# Patient Record
Sex: Female | Born: 1965 | Race: White | Hispanic: No | Smoking: Former smoker
Health system: Southern US, Community
[De-identification: ages and names within clinical notes are randomized; demographics above are authoritative.]

## PROBLEM LIST (undated history)

## (undated) DIAGNOSIS — G43909 Migraine, unspecified, not intractable, without status migrainosus: Secondary | ICD-10-CM

## (undated) DIAGNOSIS — Z87820 Personal history of traumatic brain injury: Secondary | ICD-10-CM

## (undated) DIAGNOSIS — Z86010 Personal history of colonic polyps: Secondary | ICD-10-CM

## (undated) DIAGNOSIS — Z860101 Personal history of adenomatous and serrated colon polyps: Secondary | ICD-10-CM

## (undated) DIAGNOSIS — N950 Postmenopausal bleeding: Secondary | ICD-10-CM

## (undated) DIAGNOSIS — Z8679 Personal history of other diseases of the circulatory system: Secondary | ICD-10-CM

## (undated) DIAGNOSIS — K219 Gastro-esophageal reflux disease without esophagitis: Secondary | ICD-10-CM

## (undated) DIAGNOSIS — R011 Cardiac murmur, unspecified: Secondary | ICD-10-CM

## (undated) DIAGNOSIS — M1990 Unspecified osteoarthritis, unspecified site: Secondary | ICD-10-CM

## (undated) DIAGNOSIS — F419 Anxiety disorder, unspecified: Secondary | ICD-10-CM

## (undated) DIAGNOSIS — T8859XA Other complications of anesthesia, initial encounter: Secondary | ICD-10-CM

## (undated) DIAGNOSIS — R112 Nausea with vomiting, unspecified: Secondary | ICD-10-CM

## (undated) DIAGNOSIS — Z973 Presence of spectacles and contact lenses: Secondary | ICD-10-CM

## (undated) HISTORY — PX: REPLACEMENT TOTAL KNEE: SUR1224

## (undated) HISTORY — DX: Migraine, unspecified, not intractable, without status migrainosus: G43.909

## (undated) HISTORY — PX: OTHER SURGICAL HISTORY: SHX169

## (undated) HISTORY — DX: Anxiety disorder, unspecified: F41.9

## (undated) SURGERY — Surgical Case
Anesthesia: *Unknown

---

## 1987-10-24 HISTORY — PX: MANDIBLE SURGERY: SHX707

## 1990-10-23 HISTORY — PX: ROTATOR CUFF REPAIR: SHX139

## 1999-07-18 ENCOUNTER — Other Ambulatory Visit: Admission: RE | Admit: 1999-07-18 | Discharge: 1999-07-18 | Payer: Self-pay | Admitting: Obstetrics and Gynecology

## 2000-07-23 ENCOUNTER — Other Ambulatory Visit: Admission: RE | Admit: 2000-07-23 | Discharge: 2000-07-23 | Payer: Self-pay | Admitting: Obstetrics and Gynecology

## 2000-10-23 HISTORY — PX: NASAL SINUS SURGERY: SHX719

## 2001-08-06 ENCOUNTER — Other Ambulatory Visit: Admission: RE | Admit: 2001-08-06 | Discharge: 2001-08-06 | Payer: Self-pay | Admitting: Obstetrics and Gynecology

## 2001-08-08 ENCOUNTER — Encounter: Payer: Self-pay | Admitting: Obstetrics and Gynecology

## 2001-08-08 ENCOUNTER — Encounter: Admission: RE | Admit: 2001-08-08 | Discharge: 2001-08-08 | Payer: Self-pay | Admitting: Obstetrics and Gynecology

## 2002-08-19 ENCOUNTER — Other Ambulatory Visit: Admission: RE | Admit: 2002-08-19 | Discharge: 2002-08-19 | Payer: Self-pay | Admitting: Obstetrics and Gynecology

## 2002-08-22 ENCOUNTER — Encounter: Admission: RE | Admit: 2002-08-22 | Discharge: 2002-08-22 | Payer: Self-pay | Admitting: Obstetrics and Gynecology

## 2002-08-22 ENCOUNTER — Encounter: Payer: Self-pay | Admitting: Obstetrics and Gynecology

## 2003-11-10 ENCOUNTER — Other Ambulatory Visit: Admission: RE | Admit: 2003-11-10 | Discharge: 2003-11-10 | Payer: Self-pay | Admitting: Obstetrics and Gynecology

## 2003-11-17 ENCOUNTER — Encounter: Admission: RE | Admit: 2003-11-17 | Discharge: 2003-11-17 | Payer: Self-pay | Admitting: Obstetrics and Gynecology

## 2005-04-17 ENCOUNTER — Other Ambulatory Visit: Admission: RE | Admit: 2005-04-17 | Discharge: 2005-04-17 | Payer: Self-pay | Admitting: Obstetrics and Gynecology

## 2006-04-20 ENCOUNTER — Other Ambulatory Visit: Admission: RE | Admit: 2006-04-20 | Discharge: 2006-04-20 | Payer: Self-pay | Admitting: Obstetrics & Gynecology

## 2007-04-23 ENCOUNTER — Other Ambulatory Visit: Admission: RE | Admit: 2007-04-23 | Discharge: 2007-04-23 | Payer: Self-pay | Admitting: Obstetrics and Gynecology

## 2008-04-27 ENCOUNTER — Other Ambulatory Visit: Admission: RE | Admit: 2008-04-27 | Discharge: 2008-04-27 | Payer: Self-pay | Admitting: Obstetrics & Gynecology

## 2009-01-29 HISTORY — PX: CARPAL TUNNEL RELEASE: SHX101

## 2011-11-03 ENCOUNTER — Other Ambulatory Visit: Payer: Self-pay | Admitting: Obstetrics and Gynecology

## 2011-11-03 DIAGNOSIS — Z1231 Encounter for screening mammogram for malignant neoplasm of breast: Secondary | ICD-10-CM

## 2011-11-20 ENCOUNTER — Ambulatory Visit
Admission: RE | Admit: 2011-11-20 | Discharge: 2011-11-20 | Disposition: A | Discharge status: Home / Self Care | Payer: PRIVATE HEALTH INSURANCE | Source: Ambulatory Visit | Attending: Obstetrics and Gynecology | Admitting: Obstetrics and Gynecology

## 2011-11-20 DIAGNOSIS — Z1231 Encounter for screening mammogram for malignant neoplasm of breast: Secondary | ICD-10-CM

## 2011-11-23 ENCOUNTER — Other Ambulatory Visit: Payer: Self-pay | Admitting: Obstetrics and Gynecology

## 2011-11-23 DIAGNOSIS — R928 Other abnormal and inconclusive findings on diagnostic imaging of breast: Secondary | ICD-10-CM

## 2011-12-01 ENCOUNTER — Ambulatory Visit
Admission: RE | Admit: 2011-12-01 | Discharge: 2011-12-01 | Disposition: A | Discharge status: Home / Self Care | Payer: PRIVATE HEALTH INSURANCE | Source: Ambulatory Visit | Attending: Obstetrics and Gynecology | Admitting: Obstetrics and Gynecology

## 2011-12-01 DIAGNOSIS — R928 Other abnormal and inconclusive findings on diagnostic imaging of breast: Secondary | ICD-10-CM

## 2013-07-02 ENCOUNTER — Telehealth: Payer: Self-pay | Admitting: Certified Nurse Midwife

## 2013-07-02 NOTE — Telephone Encounter (Signed)
Patient calling in today wanting [place] to give her insurance a prior auth for her Fluoxetine.    [facility]

## 2013-07-02 NOTE — Telephone Encounter (Signed)
Routed to triage 

## 2013-07-08 ENCOUNTER — Other Ambulatory Visit: Payer: Self-pay | Admitting: Orthopedic Surgery

## 2013-07-08 NOTE — Telephone Encounter (Signed)
Spoke with pharmacist to see if PA still needed for this med. Pharmacist stated pt picked up med with a discount card yesterday without any difficulty. No PA needed at this time.

## 2013-07-25 NOTE — Telephone Encounter (Signed)
Prior authorization completed and notified [facility]. Approved from 07/14/13-10/22/13

## 2013-08-12 ENCOUNTER — Telehealth: Payer: Self-pay

## 2013-08-12 NOTE — Telephone Encounter (Signed)
Message copied by Eliezer Bottom on Tue Aug 12, 2013  2:19 PM ------      Message from: Alisa Graff      Created: Tue Aug 12, 2013 11:20 AM      Regarding: mmg recall       Patient did not go for 6 month MMG.  It is now time for her 1 year MMG. Can you please contact her again.  ------

## 2013-08-12 NOTE — Telephone Encounter (Signed)
Pt states that with her new insurance that she will have to meet her 750 dollar deductible so she will have to pay 150 for a mammo. Pt states she is unable to do that right now because when she comes in for her aex on 11-11-4 with [facility] that she will have to pay for that. Im not sure if any mammo center is doing free mammos yet. I told patient we could check & it can also be discussed at her aex with PG

## 2013-08-12 NOTE — Telephone Encounter (Signed)
lmtcb

## 2013-08-26 NOTE — Telephone Encounter (Signed)
Routed to [facility]

## 2013-08-28 ENCOUNTER — Encounter: Payer: Self-pay | Admitting: Obstetrics & Gynecology

## 2013-08-29 ENCOUNTER — Ambulatory Visit: Payer: Self-pay | Admitting: Certified Nurse Midwife

## 2013-09-02 ENCOUNTER — Encounter: Payer: Self-pay | Admitting: Nurse Practitioner

## 2013-09-02 ENCOUNTER — Ambulatory Visit (INDEPENDENT_AMBULATORY_CARE_PROVIDER_SITE_OTHER): Payer: No Typology Code available for payment source | Admitting: Nurse Practitioner

## 2013-09-02 VITALS — BP 124/80 | HR 64 | Ht 63.0 in | Wt 160.0 lb

## 2013-09-02 DIAGNOSIS — Z01419 Encounter for gynecological examination (general) (routine) without abnormal findings: Secondary | ICD-10-CM

## 2013-09-02 DIAGNOSIS — Z0000 Encounter for general adult medical examination without abnormal findings: Secondary | ICD-10-CM

## 2013-09-02 MED ORDER — FLUOXETINE HCL 10 MG PO TABS: 10.0000 mg | ORAL_TABLET | Freq: Every day | ORAL | Status: DC

## 2013-09-02 MED ORDER — MEDROXYPROGESTERONE ACETATE 10 MG PO TABS: 10.0000 mg | ORAL_TABLET | Freq: Every day | ORAL | Status: DC

## 2013-09-02 NOTE — Progress Notes (Signed)
Patient ID: Amy Barr, female   DOB: 20-Mar-1966, 47 y.o.   MRN: [id number] 47 y.o. G0P0 Married Caucasian Fe here for annual exam.  She is doing well.  She has had problems with insurance coverage so has not been back to get follow up mammograms as requested.  She is aware that she is past due.  Her LMP was 7/26.  Prior to that for this year only menses was February and  April.  All 3 cycles this year were normal. She has mild if any vaso symptoms.  She feels that if she were not on her Prozac she would be more tense or anxious.  Patient's last menstrual period was 05/17/2013.          Sexually active: yes  The current method of family planning is none.    Exercising: yes  Home exercise routine includes walking and mild cardio 3-4 hours per week. Smoker:  no  Health Maintenance: Pap:  Normal with negative HR HPV 08/27/12 MMG:  11/22/11, Diag R 12/01/11 probably benign findings TDaP:  07/2011 Labs: will return fasting   reports that she quit smoking about 20 months ago. She has never used smokeless tobacco. She reports that she drinks alcohol. She reports that she does not use illicit drugs.  Past Medical History  Diagnosis Date  . Migraine   . Anxiety     Past Surgical History  Procedure Laterality Date  . Rotator cuff repair Right 1992  . Carpal tunnel release Right 2010  . Nasal sinus surgery  2001  . Mandible surgery  1991    Current Outpatient Prescriptions  Medication Sig Dispense Refill  . FLUoxetine (PROZAC) 10 MG tablet Take 1 tablet (10 mg total) by mouth daily.  90 tablet  3  . SUMAtriptan Succinate (SUMAVEL DOSEPRO) 6 MG/0.5ML SOTJ Inject into the skin.      Marland Kitchen topiramate (TOPAMAX) 200 MG tablet Take 2 tablets by mouth daily.      . medroxyPROGESTERone (PROVERA) 10 MG tablet Take 1 tablet (10 mg total) by mouth daily.  10 tablet  0   No current facility-administered medications for this visit.    Family History  Problem Relation Age of Onset  . Alzheimer's disease  Mother   . Hyperlipidemia Mother   . Thyroid disease Mother   . Diabetes Father   . Stroke Father     ROS:  Pertinent items are noted in HPI.  Otherwise, a comprehensive ROS was negative.  Exam:   BP 124/80  Pulse 64  Ht 5\' 3"  (1.6 m)  Wt 160 lb (72.576 kg)  BMI 28.35 kg/m2  LMP 05/17/2013 Height: 5\' 3"  (160 cm)  Ht Readings from Last 3 Encounters:  09/02/13 5\' 3"  (1.6 m)    General appearance: alert, cooperative and appears stated age Head: Normocephalic, without obvious abnormality, atraumatic Neck: no adenopathy, supple, symmetrical, trachea midline and thyroid normal to inspection and palpation Lungs: clear to auscultation bilaterally Breasts: normal appearance, no masses or tenderness, left breast is normal.  Area of concern in right breast at 1:00 position on mammogram, feels like a cluster of FCB changes without a discreet mass. No nipple discharge and no nodes. Heart: regular rate and rhythm Abdomen: soft, non-tender; no masses,  no organomegaly Extremities: extremities normal, atraumatic, no cyanosis or edema Skin: Skin color, texture, turgor normal. No rashes or lesions Lymph nodes: Cervical, supraclavicular, and axillary nodes normal. No abnormal inguinal nodes palpated Neurologic: Grossly normal   Pelvic: External genitalia:  no  lesions              Urethra:  normal appearing urethra with no masses, tenderness or lesions              Bartholin's and Skene's: normal                 Vagina: normal appearing vagina with normal color and discharge, no lesions              Cervix: anteverted              Pap taken: no Bimanual Exam:  Uterus:  normal size, contour, position, consistency, mobility, non-tender              Adnexa: no mass, fullness, tenderness               Rectovaginal: Confirms               Anus:  normal sphincter tone, no lesions  A:  Well Woman with normal exam  Perimenopausal consistent now with irregular cycles, amenorrhea since July  History  of abnormal mammogram that looks like a complex cyst - failed to return for further follow up secondary to no insurance  Situational depression and anxiety.  P:   Pap smear as per guidelines  Rx of Provera 10 mg for 10 days and call us with +/- results. She understands rationale.  Refill on Fluoxetine 10 mg daily for a year.   Mammogram encouraged many times to get done this year.  States she plans on doing next year - when asked when? ... she states in March or April.  If she has to meet her deductible first - informed that after her visit for this year including labs - she would have met that out of pocket expense and encouraged to get Mammo this year.  She again does not want done now.  Counseled on breast self exam, mammography screening, adequate intake of calcium and vitamin D, diet and exercise return annually or prn  An After Visit Summary was printed and given to the patient.

## 2013-09-02 NOTE — Patient Instructions (Signed)

## 2013-09-03 NOTE — Progress Notes (Signed)
Encounter reviewed by Dr. Gianelle Mccaul Silva.  

## 2013-09-04 NOTE — Progress Notes (Signed)
Reviewed personally.  M. Suzanne Badr Piedra, MD.  

## 2013-09-05 ENCOUNTER — Encounter: Payer: Self-pay | Admitting: Obstetrics & Gynecology

## 2013-09-10 ENCOUNTER — Other Ambulatory Visit: Payer: No Typology Code available for payment source

## 2013-09-10 ENCOUNTER — Other Ambulatory Visit: Payer: Self-pay | Admitting: Nurse Practitioner

## 2013-09-10 DIAGNOSIS — N6310 Unspecified lump in the right breast, unspecified quadrant: Secondary | ICD-10-CM

## 2013-09-10 DIAGNOSIS — Z0000 Encounter for general adult medical examination without abnormal findings: Secondary | ICD-10-CM

## 2013-09-11 ENCOUNTER — Encounter: Payer: Self-pay | Admitting: Nurse Practitioner

## 2013-09-11 LAB — COMPREHENSIVE METABOLIC PANEL
ALT: 21 U/L (ref 0–35)
AST: 21 U/L (ref 0–37)
Albumin: 4.5 g/dL (ref 3.5–5.2)
Alkaline Phosphatase: 55 U/L (ref 39–117)
BUN: 11 mg/dL (ref 6–23)
Calcium: 9 mg/dL (ref 8.4–10.5)
Chloride: 104 mEq/L (ref 96–112)
Potassium: 4.5 mEq/L (ref 3.5–5.3)
Sodium: 138 mEq/L (ref 135–145)
Total Protein: 6.7 g/dL (ref 6.0–8.3)

## 2013-09-11 LAB — VITAMIN D 25 HYDROXY (VIT D DEFICIENCY, FRACTURES): Vit D, 25-Hydroxy: 54 ng/mL (ref 30–89)

## 2013-09-11 LAB — THYROID PANEL WITH TSH: TSH: 1.921 u[IU]/mL (ref 0.350–4.500)

## 2013-09-11 LAB — LIPID PANEL
Cholesterol: 199 mg/dL (ref 0–200)
HDL: 40 mg/dL (ref >39)
LDL Cholesterol: 112 mg/dL — ABNORMAL HIGH (ref 0–99)
Triglycerides: 237 mg/dL — ABNORMAL HIGH (ref <150)

## 2013-09-11 NOTE — Telephone Encounter (Signed)
MMG was discussed with patient by [name] at [facility] and patient continues to decline.  Letter was written by Dr Hyacinth Meeker and mailed to patient stressing recommendation for MMG.  Appt for MMG is now scheduled for 10-10-13 as viewed in EPIC. Out of recall. Routing to provider for final review. Patient agreeable to disposition. Will close encounter

## 2013-09-16 ENCOUNTER — Telehealth: Payer: Self-pay | Admitting: Nurse Practitioner

## 2013-09-16 NOTE — Telephone Encounter (Signed)
So how many days of Provera did she take and is she having a heavy bleed?  Have to call at end of cycle and report.  Then do a menses calendar and call back again if no menses in 3 months.

## 2013-09-16 NOTE — Telephone Encounter (Signed)
Patient just wanted to let patty and stephanie know that she started her cycle and stopped taking the estrogen.

## 2013-09-17 NOTE — Telephone Encounter (Signed)
Message left to return call to Desi Carby at [phone].    

## 2013-09-17 NOTE — Telephone Encounter (Signed)
Spoke with patient. She took 10 days of provera and started on 11/11. She started period on 11/22. States that bleeding was heavier on days 2-3 and now period has stopped as of today.

## 2013-09-22 NOTE — Telephone Encounter (Signed)
This is good information to have.   Now we know that she had a withdrawal bleed, she is to keep a calendar and call us again in 3 months if no cycle.

## 2013-09-22 NOTE — Telephone Encounter (Signed)
Spoke with pt to advise her to keep a calendar of bleeding and to call if she goes 3 months with no bleeding. Pt agreeable.

## 2013-09-25 ENCOUNTER — Other Ambulatory Visit: Payer: Self-pay | Admitting: Obstetrics and Gynecology

## 2013-09-25 ENCOUNTER — Other Ambulatory Visit: Payer: Self-pay | Admitting: *Deleted

## 2013-09-25 DIAGNOSIS — N6310 Unspecified lump in the right breast, unspecified quadrant: Secondary | ICD-10-CM

## 2013-09-26 ENCOUNTER — Other Ambulatory Visit: Payer: Self-pay | Admitting: Obstetrics and Gynecology

## 2013-09-26 DIAGNOSIS — N6310 Unspecified lump in the right breast, unspecified quadrant: Secondary | ICD-10-CM

## 2013-09-30 ENCOUNTER — Ambulatory Visit
Admission: RE | Admit: 2013-09-30 | Discharge: 2013-09-30 | Disposition: A | Discharge status: Home / Self Care | Payer: PRIVATE HEALTH INSURANCE | Source: Ambulatory Visit | Attending: Nurse Practitioner | Admitting: Nurse Practitioner

## 2013-09-30 DIAGNOSIS — N6310 Unspecified lump in the right breast, unspecified quadrant: Secondary | ICD-10-CM

## 2014-02-25 ENCOUNTER — Telehealth: Payer: Self-pay | Admitting: Nurse Practitioner

## 2014-02-25 NOTE — Telephone Encounter (Signed)
Patient was told to call if she did not have her cycle for 3 months .

## 2014-02-25 NOTE — Telephone Encounter (Signed)
Lauro FranklinPatricia Rolen-Grubb, FNP, patient was started on 11/11 and started cycle on 11/22. Patient was advised to keep menses calendar and to call back in 3 months if no cycle. Patient is calling to let us know that she has not had a cycle in three months. What would you suggest next for patient?

## 2014-02-25 NOTE — Telephone Encounter (Signed)
Looks like she took Provera challenge in November and had a withdrawal bleed.  Now at 3+ months out with amenorrhea.  She should do another Provera challenge (10 mg for 10 days) and still do menses record - if prolonged bleeding or if no bleeding to call.  She is still young so may not be menopausal yet.

## 2014-02-26 MED ORDER — MEDROXYPROGESTERONE ACETATE 10 MG PO TABS: 10.0000 mg | ORAL_TABLET | Freq: Every day | ORAL | Status: DC

## 2014-02-26 NOTE — Telephone Encounter (Signed)
Spoke with patient. Message from Lauro FranklinPatricia Rolen-Grubb, FNP given as seen below. Patient is agreeable and verbalizes understanding. Prescription sent to pharmacy of choice. Patient will keep record of menses and will call back if prolonged bleeding or no cycle after completing Provera challenge. Advised if she is going to have cycle after completing Provera it could up to two weeks to see bleeding. Patient agreeable and will give our office a call with updates.  Routing to provider for final review. Patient agreeable to disposition. Will close encounter.

## 2014-04-03 ENCOUNTER — Telehealth: Payer: Self-pay | Admitting: Nurse Practitioner

## 2014-04-03 NOTE — Telephone Encounter (Signed)
Left message to call Kaitlyn at [phone]. 

## 2014-04-03 NOTE — Telephone Encounter (Signed)
Dr.Silva, patient had provera challenge in November with withdrawal bleed. Was instructed to keep menses calendar for 3 months and call back if not cycle. Patient called 5/6 stating that she has not had a cycle for 3 months and was started on another provera challenge. Patient is calling in today stating that it has been two weeks since completing Provera and no withdrawal bleed. Would you like patient to come in for lab work/ office visit at this time?

## 2014-04-03 NOTE — Telephone Encounter (Signed)
Please have patient make an appointment to see Shirlyn GoltzPatty Grubb and do blood work.

## 2014-04-03 NOTE — Telephone Encounter (Signed)
Patient called during lunch wanting to let patty know that she finished the provera about 2 weeks ago and still has not started her cycle

## 2014-04-07 NOTE — Telephone Encounter (Signed)
Spoke with patient. Advised of message as seen below from Dr.Silva. Patient agreeable. Patient requesting late afternoon appointment due to work schedule. Offered 2:15pm on June 22nd but patient declines. Patient states that June 23rd will not work all day.Appointment made for June 29th at 4:15pm with Lauro FranklinPatricia Rolen-Grubb, FNP as this is the earliest afternoon appointment with Patty that also works with patient's schedule. Patient agreeable to date and time.  Routing to Dr.Silva as covering CC: Lauro FranklinPatricia Rolen-Grubb, FNP   Routing to provider for final review. Patient agreeable to disposition. Will close encounter

## 2014-04-07 NOTE — Telephone Encounter (Signed)
Left message to call Kaitlyn at [phone]. 

## 2014-04-15 ENCOUNTER — Encounter: Payer: Self-pay | Admitting: *Deleted

## 2014-04-20 ENCOUNTER — Ambulatory Visit (INDEPENDENT_AMBULATORY_CARE_PROVIDER_SITE_OTHER): Payer: 59 | Admitting: Nurse Practitioner

## 2014-04-20 ENCOUNTER — Encounter: Payer: Self-pay | Admitting: Nurse Practitioner

## 2014-04-20 VITALS — BP 122/84 | HR 72 | Ht 62.25 in | Wt 158.0 lb

## 2014-04-20 DIAGNOSIS — N951 Menopausal and female climacteric states: Secondary | ICD-10-CM

## 2014-04-20 MED ORDER — ESTRADIOL 0.5 MG PO TABS
0.5000 mg | ORAL_TABLET | Freq: Every day | ORAL | Status: DC
Start: 2014-04-20 — End: 2014-07-15

## 2014-04-20 MED ORDER — MEDROXYPROGESTERONE ACETATE 5 MG PO TABS: 5.0000 mg | ORAL_TABLET | Freq: Every day | ORAL | Status: DC

## 2014-04-20 NOTE — Patient Instructions (Signed)
Menopause Menopause is the normal time of life when menstrual periods stop completely. Menopause is complete when you have missed 12 consecutive menstrual periods. It usually occurs between the ages of 48 years and 55 years. Very rarely does a woman develop menopause before the age of 40 years. At menopause, your ovaries stop producing the female hormones estrogen and progesterone. This can cause undesirable symptoms and also affect your health. Sometimes the symptoms may occur 4-5 years before the menopause begins. There is no relationship between menopause and:  Oral contraceptives.  Number of children you had.  Race.  The age your menstrual periods started (menarche). Heavy smokers and very thin women may develop menopause earlier in life. CAUSES  The ovaries stop producing the female hormones estrogen and progesterone.  Other causes include:  Surgery to remove both ovaries.  The ovaries stop functioning for no known reason.  Tumors of the pituitary gland in the brain.  Medical disease that affects the ovaries and hormone production.  Radiation treatment to the abdomen or pelvis.  Chemotherapy that affects the ovaries. SYMPTOMS   Hot flashes.  Night sweats.  Decrease in sex drive.  Vaginal dryness and thinning of the vagina causing painful intercourse.  Dryness of the skin and developing wrinkles.  Headaches.  Tiredness.  Irritability.  Memory problems.  Weight gain.  Bladder infections.  Hair growth of the face and chest.  Infertility. More serious symptoms include:  Loss of bone (osteoporosis) causing breaks (fractures).  Depression.  Hardening and narrowing of the arteries (atherosclerosis) causing heart attacks and strokes. DIAGNOSIS   When the menstrual periods have stopped for 12 straight months.  Physical exam.  Hormone studies of the blood. TREATMENT  There are many treatment choices and nearly as many questions about them. The  decisions to treat or not to treat menopausal changes is an individual choice made with your health care provider. Your health care provider can discuss the treatments with you. Together, you can decide which treatment will work best for you. Your treatment choices may include:   Hormone therapy (estrogen and progesterone).  Non-hormonal medicines.  Treating the individual symptoms with medicine (for example antidepressants for depression).  Herbal medicines that may help specific symptoms.  Counseling by a psychiatrist or psychologist.  Group therapy.  Lifestyle changes including:  Eating healthy.  Regular exercise.  Limiting caffeine and alcohol.  Stress management and meditation.  No treatment. HOME CARE INSTRUCTIONS   Take the medicine your health care provider gives you as directed.  Get plenty of sleep and rest.  Exercise regularly.  Eat a diet that contains calcium (good for the bones) and soy products (acts like estrogen hormone).  Avoid alcoholic beverages.  Do not smoke.  If you have hot flashes, dress in layers.  Take supplements, calcium, and vitamin D to strengthen bones.  You can use over-the-counter lubricants or moisturizers for vaginal dryness.  Group therapy is sometimes very helpful.  Acupuncture may be helpful in some cases. SEEK MEDICAL CARE IF:   You are not sure you are in menopause.  You are having menopausal symptoms and need advice and treatment.  You are still having menstrual periods after age 55 years.  You have pain with intercourse.  Menopause is complete (no menstrual period for 12 months) and you develop vaginal bleeding.  You need a referral to a specialist (gynecologist, psychiatrist, or psychologist) for treatment. SEEK IMMEDIATE MEDICAL CARE IF:   You have severe depression.  You have excessive vaginal bleeding.    You fell and think you have a broken bone.  You have pain when you urinate.  You develop leg or  chest pain.  You have a fast pounding heart beat (palpitations).  You have severe headaches.  You develop vision problems.  You feel a lump in your breast.  You have abdominal pain or severe indigestion. Document Released: 12/30/2003 Document Revised: 06/11/2013 Document Reviewed: 05/08/2013 ExitCare Patient Information 2015 [employer]. This information is not intended to replace advice given to you by your health care provider. Make sure you discuss any questions you have with your health care provider.  

## 2014-04-20 NOTE — Progress Notes (Signed)
Patient ID: Amy Barr, female   DOB: 09/18/1966, 48 y.o.   MRN: [id number] S: This 48 yo G0,P0 MW Fe presents to discuss starting HRT.  She had only 3 normal menses in 2014.  Then at [facility] in November she was given a Provera challenge and did have a 2-3 days menses.  In May, again called with amenorrhea and had an increase in vaso symptoms with feeling warm in general.  She was given another Provera challenge and did not have a withdrawal bleed.  She is having noted symptoms of weight gain, decrease in energy, brain fog, increase in vaginal dryness and in general dryness of skin. 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 The insomnia is long term and usually is helped with melatonin.  Now does very little help. She feels the vaso symptoms are causing most of the insomnia.  She wants hormone testing and med's for treatment.  She has no family medical history of breast cancer.  She did take OCP in the past without problems.  No cardiac disease.      A: Menopausal symptoms - not tolerable  Amenorrhea with Provera challenge   Plan: Discussed at length about WHI study and +/- risks and benefits.  She would like to pursue HRT.  She is given Estradiol 0.5 mg daily and Provera 5 mg daily.  She is informed of potential spotting or bleeding initially on HRT.  We discussed other risk of DVT, CVA, and cancer.  She will be doing 3 D Mammo again this year along with monthly SBE. She is advised if any long trips or injury that leads to immobility that can put her at risk for  DVT. She will be rechecked in 3 months.  She will also be followed with her labs. TSH, FSH, and Hgb AIC is ordered today.   Consultation time: 20 minutes face to face

## 2014-04-21 LAB — HEMOGLOBIN A1C
HEMOGLOBIN A1C: 5.5 % (ref <5.7)
MEAN PLASMA GLUCOSE: 111 mg/dL (ref <117)

## 2014-04-21 LAB — TSH: TSH: 2.075 u[IU]/mL (ref 0.350–4.500)

## 2014-04-21 NOTE — Progress Notes (Signed)
Encounter reviewed by Dr. Brook Silva.  

## 2014-04-22 LAB — FOLLICLE STIMULATING HORMONE: FSH: 102.1 m[IU]/mL

## 2014-05-06 ENCOUNTER — Telehealth: Payer: Self-pay | Admitting: Nurse Practitioner

## 2014-05-06 NOTE — Telephone Encounter (Signed)
Spoke with patient who states she started provera and estrace approximately two weeks ago. Has not missed any doses, taking same time at night after dinner at 8 pm before bed.  Started what she feels is like a period that started yesterday with spotting and today having increase bleeding but not heavy per patient. Like a period for her but she was used to having much heavier bleeding during last menses last July 2014. Advised of last note from Lauro FranklinPatricia Rolen-Grubb, FNP at day of visit with starting HRT that it was expected that she may have some bleeding when first starting the HRT. Advised to keep bleeding calendar and monitor bleeding. If worsens or soaking pads, changing pad q hour, to call back immediately.  Advised would send message to Lauro FranklinPatricia Rolen-Grubb, FNP to review and advice.  Patty, okay for patient to monitor or needs office visit? [facility]

## 2014-05-06 NOTE — Telephone Encounter (Signed)
OK to monitor. Expected bleeding for her but should get lighter and better as time goes on.  Call if persist or heavy.

## 2014-05-06 NOTE — Telephone Encounter (Signed)
Pt is on hrt medication and started period on yesterday. Please call to advise.

## 2014-05-07 NOTE — Telephone Encounter (Signed)
Spoke with patient. She is given message from Lauro FranklinPatricia Rolen-Grubb, FNP. She states that cycle has slowed down but that bleeding did continue to be more "like a period". She will monitor symptoms and call back with any changes.  Routing to provider for final review. Patient agreeable to disposition. Will close encounter

## 2014-05-08 NOTE — Telephone Encounter (Signed)
agree

## 2014-07-15 ENCOUNTER — Other Ambulatory Visit: Payer: Self-pay | Admitting: Nurse Practitioner

## 2014-07-16 NOTE — Telephone Encounter (Signed)
eScribe request from [facility] for refill on PROVERA AND ESTRADIOL Last filled - 04/20/14, #90 X 0 Last AEX - 09/02/13 Next AEX - 09/08/14 Last MMG - 09/30/13, Bi-Rads 2: probable benign, repeat in 1 year  Pt began HRT in June 2015.  Per last OV note patient needs 3 month follow up appt.  This is not scheduled.  Please advise if OK to fill until patient can schedule appt.

## 2014-07-19 NOTE — Telephone Encounter (Signed)
OK to refill but may need to call her and get recheck scheduled.  Then refill until then.

## 2014-07-20 ENCOUNTER — Telehealth: Payer: Self-pay | Admitting: Nurse Practitioner

## 2014-07-20 NOTE — Telephone Encounter (Signed)
RC to patient.  Pt has appt on 09/08/14 for [facility].  Pt states she "CAN NOT" get here any sooner than that due to "so many out-of-town events".  Advised pt I would fill until her appt in November, but she has to be seen at that time for more refills. 60 day supply sent to pharmacy.  Pt agreeable with this plan.

## 2014-07-20 NOTE — Telephone Encounter (Signed)
See refill request dated 07/15/14.  Closing encounter.

## 2014-07-20 NOTE — Telephone Encounter (Signed)
Pt returning call

## 2014-07-20 NOTE — Telephone Encounter (Signed)
Patient is asking for status on her refill. Patient says her pharmacy told her to call our office regarding her prescription. The pharmacy has gotten no response from our office.

## 2014-07-20 NOTE — Telephone Encounter (Signed)
I have attempted to contact this patient by phone with the following results: left message to return my call on answering machine [phone]).

## 2014-08-13 ENCOUNTER — Other Ambulatory Visit: Payer: Self-pay | Admitting: Nurse Practitioner

## 2014-08-13 NOTE — Telephone Encounter (Signed)
[facility]:[id number]:Prozac 10mg   Last AEX:09/02/13 Last Refill:09/02/13 #90 X 3 Next AEX:09/08/14 Last MMG:09/30/13 Bi-Rads Benign  Please Advise

## 2014-08-24 ENCOUNTER — Encounter: Payer: Self-pay | Admitting: Nurse Practitioner

## 2014-08-28 ENCOUNTER — Telehealth: Payer: Self-pay | Admitting: Nurse Practitioner

## 2014-08-28 NOTE — Telephone Encounter (Signed)
Spoke with patient. She states she is in remote area of [address] [place] and had a fall last night. Describes that husband lost his balance and subsequently patient fell on tile floor. Reports knee swelling and "hematoma" that is grapefruit sized from swelling. Patient reports that calf is swelling and hot to the touch. Dark bruising noted on leg. Patient reports tightness in back of leg. Patient is using ice and elevation with some relief in symptoms, but patient wanted to know what to do in the meantime. Felt improved after walk in the beach this morning.   Patient last look estrace and provera last night.  Wants to know if can continue to take as prescribed.  Advised patient that with the symptoms that she described that she needs to be evaluated by a Doctor and should have area assessed, can be muscle injury but blood clot needs to be ruled out.  Patient states she is in a remote area and cannot seek care. She states she will be back in the area after trip and will seek care at this time. Patient will have long car ride home.   Advised would send her request for advise to Dr. Edward JollySilva as covering provider and return her call with instructions.

## 2014-08-28 NOTE — Telephone Encounter (Signed)
I recommend stopping all hormone treatment and seek medical care today for evaluation for possible deep venous thrombosis.  It is important not to delay care.  If a deep venous thrombosis has occurred, it can break off and go to the lung and cause a pulmonary embolus, which can be life threatening.   Sorry to be so direct about it, but patient needs care.

## 2014-08-28 NOTE — Telephone Encounter (Signed)
Spoke with patient and given message from Dr. Edward JollySilva.  Patient verbalized understanding of instructions.  Patient continues to state that she is in remote area 50-100 miles away from any place for healthcare. She states "I will see what I need to do." Advised patient to call 911 if needs assistance and is in remote area. Patient verbalized understanding of need to not delay care.  Will follow up with Lauro FranklinPatricia Rolen-Grubb, FNP as scheduled for 09/08/14 or sooner as needed when returns to town.  Routing to provider for final review. Patient agreeable to disposition. Will close encounter

## 2014-08-28 NOTE — Telephone Encounter (Signed)
Patient calling requesting to speak with nurse about "HRT" and a fall she had last night which resulted in a "large hematoma."

## 2014-09-08 ENCOUNTER — Ambulatory Visit (INDEPENDENT_AMBULATORY_CARE_PROVIDER_SITE_OTHER): Payer: 59 | Admitting: Nurse Practitioner

## 2014-09-08 ENCOUNTER — Encounter: Payer: Self-pay | Admitting: Nurse Practitioner

## 2014-09-08 VITALS — BP 120/84 | HR 60 | Ht 63.0 in | Wt 157.0 lb

## 2014-09-08 DIAGNOSIS — Z01419 Encounter for gynecological examination (general) (routine) without abnormal findings: Secondary | ICD-10-CM

## 2014-09-08 DIAGNOSIS — Z0000 Encounter for general adult medical examination without abnormal findings: Secondary | ICD-10-CM

## 2014-09-08 LAB — POCT URINALYSIS DIPSTICK
Bilirubin, UA: NEGATIVE
Blood, UA: NEGATIVE
Glucose, UA: NEGATIVE
Ketones, UA: NEGATIVE
LEUKOCYTES UA: NEGATIVE
NITRITE UA: NEGATIVE
PH UA: 7
PROTEIN UA: NEGATIVE
UROBILINOGEN UA: NEGATIVE

## 2014-09-08 LAB — HEMOGLOBIN, FINGERSTICK: Hemoglobin, fingerstick: 14.4 g/dL (ref 12.0–16.0)

## 2014-09-08 MED ORDER — ESTRADIOL 0.5 MG PO TABS: 0.5000 mg | ORAL_TABLET | Freq: Every day | ORAL | Status: DC

## 2014-09-08 MED ORDER — MEDROXYPROGESTERONE ACETATE 5 MG PO TABS: 5.0000 mg | ORAL_TABLET | Freq: Every day | ORAL | Status: DC

## 2014-09-08 MED ORDER — FLUOXETINE HCL 10 MG PO TABS: ORAL_TABLET | ORAL | Status: DC

## 2014-09-08 NOTE — Progress Notes (Signed)
Encounter reviewed by Dr. Brook Silva.  

## 2014-09-08 NOTE — Progress Notes (Signed)
Patient ID: Amy Barr, female   DOB: 07/25/1966, 48 y.o.   MRN: [id number] 48 y.o. [id number] Married Caucasian Fe here for annual exam. She started on HRT end of June and was doing well with less vaso symptoms and felt more like herself.  Then  she had a fall on 08/27/14 and developed a hematoma of left leg.  She called right away and felt like she needed to stop HRT and did so. No withdrawal bleed when off HRT.              She currently is on hold of HRT until leg has healed.  She has compression stockings on and swelling is much better.   About this same time had a lipid panel done which showed an elevation - she is concerned that the HRT maybe directly related.  However she does have FMH of high cholesterol.  When she is ready to restart HRT we will plan to get another lipid panel prior to restarting and follow.     Patient's last menstrual period was 05/12/2014.          Sexually active: yes   The current method of family planning is vasectomy.     Exercising: yes  Home exercise routine includes walking and mild cardio 3-4 hours per week.  Has not been exercising since a fall on 08/27/14. Smoker:  no  Health Maintenance: Pap:  08/27/12, Negative with negative HR HPV  MMG:  09/30/13, Bi-Rads 2:  benign findings Colonoscopy: [place] TDaP:  07/2011 Labs:  HB:  14.4   Urine:  Negative    reports that she quit smoking about 2 years ago. She has never used smokeless tobacco. She reports that she drinks alcohol. She reports that she does not use illicit drugs.  Past Medical History  Diagnosis Date  . Migraine   . Anxiety   . Depression     Past Surgical History  Procedure Laterality Date  . Rotator cuff repair Right 1992  . Carpal tunnel release Right 2010  . Nasal sinus surgery  2001  . Mandible surgery  1991    Current Outpatient Prescriptions  Medication Sig Dispense Refill  . estradiol (ESTRACE) 0.5 MG tablet Take 1 tablet (0.5 mg total) by mouth daily. 90  tablet 3  . FLUoxetine (PROZAC) 10 MG tablet TAKE 1 TABLET (10 MG TOTAL) BY MOUTH DAILY. 90 tablet 3  . medroxyPROGESTERone (PROVERA) 5 MG tablet Take 1 tablet (5 mg total) by mouth daily. 90 tablet 3  . topiramate (TOPAMAX) 200 MG tablet Take 2 tablets by mouth daily.    Marland Kitchen. zolmitriptan (ZOMIG) 5 MG nasal solution Place into the nose as needed for migraine.    . triamterene-hydrochlorothiazide (MAXZIDE-25) 37.5-25 MG per tablet Take 1 tablet by mouth daily.     No current facility-administered medications for this visit.    Family History  Problem Relation Age of Onset  . Alzheimer's disease Mother   . Hyperlipidemia Mother   . Thyroid disease Mother   . Diabetes Father   . Stroke Father     ROS:  Pertinent items are noted in HPI.  Otherwise, a comprehensive ROS was negative.  Exam:   BP 120/84 mmHg  Pulse 60  Ht 5\' 3"  (1.6 m)  Wt 157 lb (71.215 kg)  BMI 27.82 kg/m2  LMP 05/12/2014 Height: 5\' 3"  (160 cm)  Ht Readings from Last 3 Encounters:  09/08/14 5\' 3"  (1.6 m)  04/20/14 5' 2.25" (  1.581 m)  09/02/13 5\' 3"  (1.6 m)    General appearance: alert, cooperative and appears stated age Head: Normocephalic, without obvious abnormality, atraumatic Neck: no adenopathy, supple, symmetrical, trachea midline and thyroid normal to inspection and palpation Lungs: clear to auscultation bilaterally Breasts: normal appearance, no masses or tenderness Heart: regular rate and rhythm Abdomen: soft, non-tender; no masses,  no organomegaly Extremities: extremities with superficial swelling of left lower leg with discoloration.  TED stocking are on.  Negative Homans sign., no cyanosis  Skin: Skin color, texture, turgor normal. No rashes or lesions Lymph nodes: Cervical, supraclavicular, and axillary nodes normal. No abnormal inguinal nodes palpated Neurologic: Grossly normal   Pelvic: External genitalia:  no lesions              Urethra:  normal appearing urethra with no masses, tenderness  or lesions              Bartholin's and Skene's: normal                 Vagina: normal appearing vagina with normal color and discharge, no lesions              Cervix: anteverted              Pap taken: No. Bimanual Exam:  Uterus:  normal size, contour, position, consistency, mobility, non-tender              Adnexa: no mass, fullness, tenderness               Rectovaginal: Confirms               Anus:  normal sphincter tone, no lesions  A:  Well Woman with normal exam  Postmenopausal - starting HRT 03/2014 - 08/30/14  Injury to left lower leg 08/30/14  Elevated cholesterol - ? [facility]  HTN  PMS/ menopausal mood changes  P:   Reviewed health and wellness pertinent to exam  Pap smear not taken today  Mammogram is due 12/15  Refill on HRT for a year - she will call prior to restarting them to make sure leg is comfortable without evidence of DVT.  We plan to repeat lipids prior to restarting   Counseled with risk of DVT,CVA, cancer, etc.  counseled on breast self exam, mammography screening, use and side effects of HRT, adequate intake of calcium and vitamin D, diet and exercise, Kegel's exercises return annually or prn  An After Visit Summary was printed and given to the patient.

## 2014-09-08 NOTE — Patient Instructions (Signed)

## 2014-09-24 ENCOUNTER — Encounter: Payer: Self-pay | Admitting: Nurse Practitioner

## 2014-09-24 DIAGNOSIS — E7800 Pure hypercholesterolemia, unspecified: Secondary | ICD-10-CM

## 2014-09-25 ENCOUNTER — Other Ambulatory Visit: Payer: Self-pay | Admitting: Nurse Practitioner

## 2014-09-25 NOTE — Telephone Encounter (Signed)
Left message to call Kaitlyn at [phone]. 

## 2014-09-25 NOTE — Telephone Encounter (Signed)
Spoke with patient. Patient states "My leg is doing much better. It just has light bruising that is now yellow and brown." Denies any swelling, warmth, or pain. Patient would like to schedule an appointment to have lipid levels checked so that she can restart on HRT. Appointment scheduled for 12/8 at 8:40am. Agreeable to date and time. Please see OV note from Lauro FranklinPatricia Rolen-Grubb, FNP below. Order placed for lipid panel.  Refill on HRT for a year - she will call prior to restarting them to make sure leg is comfortable without evidence of DVT. We plan to repeat lipids prior to restarting  Routing to provider for final review. Patient agreeable to disposition. Will close encounter

## 2014-09-29 ENCOUNTER — Other Ambulatory Visit (INDEPENDENT_AMBULATORY_CARE_PROVIDER_SITE_OTHER): Payer: 59

## 2014-09-29 DIAGNOSIS — E7800 Pure hypercholesterolemia, unspecified: Secondary | ICD-10-CM

## 2014-09-29 LAB — LIPID PANEL
Cholesterol: 218 mg/dL — ABNORMAL HIGH (ref 0–200)
HDL: 51 mg/dL (ref >39)
LDL CALC: 135 mg/dL — AB (ref 0–99)
Total CHOL/HDL Ratio: 4.3 Ratio
Triglycerides: 161 mg/dL — ABNORMAL HIGH (ref <150)
VLDL: 32 mg/dL (ref 0–40)

## 2014-11-18 ENCOUNTER — Telehealth: Payer: Self-pay | Admitting: *Deleted

## 2014-11-18 NOTE — Telephone Encounter (Signed)
Recall Notes:  1 year MMG recall/tf  Previous MMG 09/30/13 showed:  IMPRESSION: Interval decrease in size of the nodular density in the medial right breast. The decrease in size is compatible with a benign process. No evidence of malignancy in either breast.  RECOMMENDATION: Bilateral screening mammogram in 1 year.   Pt has not scheduled screening MMG due tin 09/2014.  [facility]  Thanks.

## 2014-11-18 NOTE — Telephone Encounter (Signed)
Left voicemail to call back and schedule MMG

## 2014-11-25 NOTE — Telephone Encounter (Signed)
Second attempt to contact patient. LM for pt to schedule MMG or to call us and we will schedule MMG for her.

## 2014-12-01 NOTE — Telephone Encounter (Signed)
Third attempt to contact patient.   Left voicemail to call back or to schedule MMG at the [facility] center.

## 2014-12-09 ENCOUNTER — Encounter: Payer: Self-pay | Admitting: *Deleted

## 2014-12-09 NOTE — Telephone Encounter (Signed)
Pt has not returned call or scheduled mammogram.  Letter created.  Please advise recall.  

## 2014-12-10 NOTE — Telephone Encounter (Signed)
Letter printed.  Ok to remove from recall once letter sent.  Thanks.

## 2014-12-10 NOTE — Telephone Encounter (Signed)
Letter mailed.  Pt removed from recall.  Closing encounter. 

## 2015-03-19 ENCOUNTER — Other Ambulatory Visit: Payer: Self-pay

## 2015-03-19 DIAGNOSIS — Z1231 Encounter for screening mammogram for malignant neoplasm of breast: Secondary | ICD-10-CM

## 2015-03-27 ENCOUNTER — Encounter: Payer: Self-pay | Admitting: Nurse Practitioner

## 2015-03-27 ENCOUNTER — Other Ambulatory Visit: Payer: Self-pay | Admitting: Nurse Practitioner

## 2015-03-29 NOTE — Telephone Encounter (Deleted)
Patient states she has a new pharmacy and she needs to give us that information because she needs a refill on her estrogen medication

## 2015-03-29 NOTE — Telephone Encounter (Signed)
Medication refill request: Estradiol and Provera  Last AEX:  09-08-14 Next AEX: 09-20-15 Last MMG (if hormonal medication request): last MM was 2014  I called patient and she is scheduled for 71-16 [facility] Refill authorized: please advise

## 2015-03-29 NOTE — Telephone Encounter (Signed)
Refills have been placed by refill pool. Refills authorized.  Message returned to patient to advise when appointment is completed.

## 2015-04-23 ENCOUNTER — Encounter: Payer: Self-pay | Admitting: Nurse Practitioner

## 2015-04-23 ENCOUNTER — Ambulatory Visit
Admission: RE | Admit: 2015-04-23 | Discharge: 2015-04-23 | Disposition: A | Discharge status: Home / Self Care | Payer: 59 | Source: Ambulatory Visit

## 2015-04-23 ENCOUNTER — Telehealth: Payer: Self-pay | Admitting: Emergency Medicine

## 2015-04-23 DIAGNOSIS — Z1231 Encounter for screening mammogram for malignant neoplasm of breast: Secondary | ICD-10-CM

## 2015-04-23 MED ORDER — MEDROXYPROGESTERONE ACETATE 5 MG PO TABS: 5.0000 mg | ORAL_TABLET | Freq: Every day | ORAL | Status: DC

## 2015-04-23 MED ORDER — FLUOXETINE HCL 10 MG PO TABS: ORAL_TABLET | ORAL | Status: DC

## 2015-04-23 NOTE — Telephone Encounter (Signed)
===  View-only below this line===   ----- Message -----    FromDelena Serve: Adamcik,Dalya    Sent: 04/23/2015  3:26 PM EDT      To: Lauro FranklinOLEN-GRUBB, PATRICIA, FNP Subject: Non-Urgent Medical Question  Good Afternoon,  [facility] info available from my mammogram for you, from July 1st.  I tried calling your office but it was closed today.  I will be out on Wednesday, July 6th.  Please [facility] the below prescriptions at [facility] [place]: ESTRADIOL 0.5 MG and MEDROXYPROGESTERONE 5 MG are the ones needed.  Thank you and I hope you have a great day!  Delena ServeBarbara Kole

## 2015-04-23 NOTE — Telephone Encounter (Signed)
Routing to AshlandPatricia Rolen-Grubb, FNP to review and okay for orders.

## 2015-04-23 NOTE — Telephone Encounter (Signed)
Telephone call for triage created to discuss message with patient and disposition as appropriate.   

## 2015-04-27 ENCOUNTER — Other Ambulatory Visit: Payer: Self-pay | Admitting: Emergency Medicine

## 2015-04-27 MED ORDER — MEDROXYPROGESTERONE ACETATE 5 MG PO TABS: 5.0000 mg | ORAL_TABLET | Freq: Every day | ORAL | Status: DC

## 2015-04-27 MED ORDER — ESTRADIOL 0.5 MG PO TABS: 0.5000 mg | ORAL_TABLET | Freq: Every day | ORAL | Status: DC

## 2015-04-27 NOTE — Telephone Encounter (Signed)
Patty, can you review.  Patient needs refills of Estradiol and Progesterone. Patient completed her mammogram and annual is current until 08/2015.

## 2015-04-27 NOTE — Telephone Encounter (Signed)
===  View-only below this line===   ----- Message -----    From: [place],Bobi    Sent: 04/23/2015  3:26 PM EDT      To: ROLEN-GRUBB, PATRICIA, FNP Subject: Non-Urgent Medical Question  Good Afternoon,  [facility] info [facility] from my mammogram for you, from July 1st.  I tried calling your office but it was closed today.  I will be out on Wednesday, July 6th.  Please refill the below prescriptions at [facility] in : ESTRADIOL 0.5 MG and MEDROXYPROGESTERONE 5 MG are the ones needed.  Thank you and I hope you have a great day!  Amy Barr 

## 2015-06-25 ENCOUNTER — Other Ambulatory Visit: Payer: Self-pay | Admitting: Nurse Practitioner

## 2015-06-25 NOTE — Telephone Encounter (Signed)
04/27/2015 Estradiol 0.5 mg #90/1 rfs sent to [facility]- rx denied.

## 2015-06-26 ENCOUNTER — Other Ambulatory Visit: Payer: Self-pay | Admitting: Nurse Practitioner

## 2015-06-29 NOTE — Telephone Encounter (Signed)
Medication refill request: Estradiol  Last AEX:  09-08-14  Next AEX: 09-20-15 Last MMG (if hormonal medication request): 04-23-15 WNL Refill authorized: please advise

## 2015-07-28 ENCOUNTER — Other Ambulatory Visit: Payer: Self-pay | Admitting: Nurse Practitioner

## 2015-07-28 NOTE — Telephone Encounter (Signed)
I have looked at her refills and she should another refill for this per records.

## 2015-07-28 NOTE — Telephone Encounter (Signed)
Medication refill request: Provera Last AEX:  09/08/14 with PG Next AEX: 09/20/15 with PG Last MMG (if hormonal medication request): 04/23/2015 category b, bi-rads  Category 1 neg Refill authorized: please advise

## 2015-07-29 NOTE — Telephone Encounter (Signed)
Patients Provera medication was sent on 04/27/15 #90 with 1 refill. Medication is to early for refill.

## 2015-09-20 ENCOUNTER — Ambulatory Visit (INDEPENDENT_AMBULATORY_CARE_PROVIDER_SITE_OTHER): Payer: 59 | Admitting: Nurse Practitioner

## 2015-09-20 ENCOUNTER — Encounter: Payer: Self-pay | Admitting: Nurse Practitioner

## 2015-09-20 VITALS — BP 128/78 | HR 64 | Ht 62.25 in | Wt 144.0 lb

## 2015-09-20 DIAGNOSIS — Z1211 Encounter for screening for malignant neoplasm of colon: Secondary | ICD-10-CM

## 2015-09-20 DIAGNOSIS — Z0000 Encounter for general adult medical examination without abnormal findings: Secondary | ICD-10-CM | POA: Diagnosis not present

## 2015-09-20 DIAGNOSIS — Z01419 Encounter for gynecological examination (general) (routine) without abnormal findings: Secondary | ICD-10-CM

## 2015-09-20 LAB — LIPID PANEL
Cholesterol: 190 mg/dL (ref 125–200)
HDL: 46 mg/dL (ref >=46)
LDL Cholesterol: 114 mg/dL (ref <130)
Total CHOL/HDL Ratio: 4.1 Ratio (ref <=5.0)
Triglycerides: 150 mg/dL — ABNORMAL HIGH (ref <150)
VLDL: 30 mg/dL (ref <30)

## 2015-09-20 LAB — COMPREHENSIVE METABOLIC PANEL
ALBUMIN: 4.6 g/dL (ref 3.6–5.1)
ALT: 23 U/L (ref 6–29)
AST: 20 U/L (ref 10–35)
Alkaline Phosphatase: 56 U/L (ref 33–130)
BUN: 15 mg/dL (ref 7–25)
CHLORIDE: 103 mmol/L (ref 98–110)
CO2: 24 mmol/L (ref 20–31)
CREATININE: 0.84 mg/dL (ref 0.50–1.05)
Calcium: 8.7 mg/dL (ref 8.6–10.4)
Glucose, Bld: 92 mg/dL (ref 65–99)
Potassium: 4.4 mmol/L (ref 3.5–5.3)
Sodium: 136 mmol/L (ref 135–146)
TOTAL PROTEIN: 6.6 g/dL (ref 6.1–8.1)
Total Bilirubin: 0.4 mg/dL (ref 0.2–1.2)

## 2015-09-20 MED ORDER — ESTRADIOL 0.5 MG PO TABS: 0.5000 mg | ORAL_TABLET | Freq: Every day | ORAL | Status: DC

## 2015-09-20 MED ORDER — MEDROXYPROGESTERONE ACETATE 5 MG PO TABS: 5.0000 mg | ORAL_TABLET | Freq: Every day | ORAL | Status: DC

## 2015-09-20 MED ORDER — FLUOXETINE HCL 10 MG PO TABS: ORAL_TABLET | ORAL | Status: DC

## 2015-09-20 NOTE — Progress Notes (Signed)
Patient ID: Amy ServeBarbara Barr, female   DOB: 08/19/1966, 49 y.o.   MRN: [id number] 49 y.o. [id number] Married  Caucasian Fe here for annual exam.  Amenorrhea since 05/12/14 with prior menses 04/2013.  She took HRT  for only 4 months until she suffered a fall with a left leg hematoma.  Was off HRT from November till 11/2014.  She then restarted after hematoma was gone and has done well since then with less vaso symptoms.  Sleep is better. No signs of DVT. Now with exercise and wt loss of 13 lbs.  Patient's last menstrual period was 05/12/2014 (exact date).          Sexually active: Yes.    The current method of family planning is vasectomy and post menopausal status.    Exercising: Yes.    Home exercise routine includes treadmill and elliptical 5 times per week and light weights 2-3 times per week.. Smoker:  no  Health Maintenance: Pap:  08/27/12, Negative with negative HR HPV  MMG: 04/23/15, Bi-Rads 1: Negative  Colonoscopy: Never - she is ready to proceed TDaP:  07/2011 Labs:  Sept and Oct 2016 with [facility]   reports that she quit smoking about 3 years ago. She has never used smokeless tobacco. She reports that she drinks alcohol. She reports that she does not use illicit drugs.  Past Medical History  Diagnosis Date  . Migraine   . Anxiety   . Depression     Past Surgical History  Procedure Laterality Date  . Rotator cuff repair Right 1992  . Carpal tunnel release Right 2010  . Nasal sinus surgery  2001  . Mandible surgery  1991    Current Outpatient Prescriptions  Medication Sig Dispense Refill  . chlorproMAZINE (THORAZINE) 25 MG tablet Take 1-2 tablets by mouth as needed.  1  . estradiol (ESTRACE) 0.5 MG tablet Take 1 tablet (0.5 mg total) by mouth daily. 90 tablet 4  . FLUoxetine (PROZAC) 10 MG tablet TAKE 1 TABLET (10 MG TOTAL) BY MOUTH DAILY. 90 tablet 4  . medroxyPROGESTERone (PROVERA) 5 MG tablet Take 1 tablet (5 mg total) by mouth daily. 90 tablet 4  . topiramate  (TOPAMAX) 200 MG tablet Take 2 tablets by mouth daily.    Marland Kitchen. zolmitriptan (ZOMIG) 5 MG nasal solution Place into the nose as needed for migraine.     No current facility-administered medications for this visit.    Family History  Problem Relation Age of Onset  . Alzheimer's disease Mother   . Hyperlipidemia Mother   . Thyroid disease Mother   . Diabetes Father   . Stroke Father     ROS:  Pertinent items are noted in HPI.  Otherwise, a comprehensive ROS was negative.  Exam:   BP 128/78 mmHg  Pulse 64  Ht 5' 2.25" (1.581 m)  Wt 144 lb (65.318 kg)  BMI 26.13 kg/m2  LMP 05/12/2014 (Exact Date) Height: 5' 2.25" (158.1 cm) Ht Readings from Last 3 Encounters:  09/20/15 5' 2.25" (1.581 m)  09/08/14 5\' 3"  (1.6 m)  04/20/14 5' 2.25" (1.581 m)    General appearance: alert, cooperative and appears stated age Head: Normocephalic, without obvious abnormality, atraumatic Neck: no adenopathy, supple, symmetrical, trachea midline and thyroid normal to inspection and palpation Lungs: clear to auscultation bilaterally Breasts: normal appearance, no masses or tenderness Heart: regular rate and rhythm Abdomen: soft, non-tender; no masses,  no organomegaly Extremities: extremities normal, atraumatic, no cyanosis or edema - no signs of  DVT Skin: Skin color, texture, turgor normal. No rashes or lesions Lymph nodes: Cervical, supraclavicular, and axillary nodes normal. No abnormal inguinal nodes palpated Neurologic: Grossly normal   Pelvic: External genitalia:  no lesions              Urethra:  normal appearing urethra with no masses, tenderness or lesions              Bartholin's and Skene's: normal                 Vagina: normal appearing vagina with normal color and discharge, no lesions              Cervix: anteverted              Pap taken: Yes.   Bimanual Exam:  Uterus:  normal size, contour, position, consistency, mobility, non-tender              Adnexa: no mass, fullness,  tenderness               Rectovaginal: Confirms               Anus:  normal sphincter tone, no lesions  Chaperone present: yes  A:  Well Woman with normal exam  Postmenopausal - starting HRT 03/2014 - 08/30/14 Injury to left lower leg 08/30/14, back on HRT 11/2014 - present Elevated cholesterol - ? related to [facility] or HRT PMS/ menopausal mood changes   P:   Reviewed health and wellness pertinent to exam  Pap smear as above  Mammogram is due 11/17  Refill on Estrace 0.5 mg daily for a year  Refill on Provera 5 mg daily for a year  Refill on Prozac 10 mg daily for a year  Counseled on risk of DVT, CVA, cancer, etc.  Will follow with labs  Counseled on breast self exam, mammography screening, adequate intake of calcium and vitamin D, diet and exercise, Kegel's exercises return annually or prn  An After Visit Summary was printed and given to the patient.

## 2015-09-20 NOTE — Progress Notes (Signed)
Encounter reviewed by Dr. Vivan Agostino Amundson C. Silva.  

## 2015-09-20 NOTE — Patient Instructions (Signed)

## 2015-09-21 LAB — HIV ANTIBODY (ROUTINE TESTING W REFLEX): HIV 1&2 Ab, 4th Generation: NONREACTIVE

## 2015-09-21 LAB — HEPATITIS C ANTIBODY: HCV AB: NEGATIVE

## 2015-09-21 LAB — VITAMIN D 25 HYDROXY (VIT D DEFICIENCY, FRACTURES): Vit D, 25-Hydroxy: 36 ng/mL (ref 30–100)

## 2015-09-22 LAB — IPS PAP TEST WITH HPV

## 2015-09-23 ENCOUNTER — Encounter: Payer: Self-pay | Admitting: Nurse Practitioner

## 2015-11-22 ENCOUNTER — Encounter: Payer: Self-pay | Admitting: Nurse Practitioner

## 2015-11-23 ENCOUNTER — Telehealth: Payer: Self-pay

## 2015-11-23 NOTE — Telephone Encounter (Signed)
Non-Urgent Medical Question  Message [phone]   From  Charli Halle   To  Ria Comment, FNP   Sent  11/22/2015 6:17 PM     Hello -   I went to pick up my Fluoxetine tonight and was advised by the Pharmacist that my insurance company is requiring prior authorization. [facility] has faxed this information over to you. I run out of Fluoxetine on 11/25/2015.    Please fax the prior authorization over at your earliest convenience, to [facility] in [place] on [address].   Thank you for your assistance in this and have a wonderful remainder of your week!   Sincerely,   Delena Serve      Responsible Party    Pool - [facility] Clinical Pool No one has taken responsibility for this message.     No actions have been taken on this message.     PA for patient's Prozac 10 mg tablets were sent to her insurance company via Cover my meds on 11/22/2015 by Almedia Balls, RN.

## 2015-11-23 NOTE — Telephone Encounter (Signed)
Left message to call Kaitlyn at [phone].  Need to advise patient that the PA for her Fluoxetine rx has been approved from 11/23/2015-11/22/2016.

## 2015-11-24 ENCOUNTER — Telehealth: Payer: Self-pay | Admitting: Nurse Practitioner

## 2015-11-24 NOTE — Telephone Encounter (Signed)
[name] at 11/24/2015 8:56 AM     Status: Signed       Expand All Collapse All   Amy Barr at [facility] calling requesting a change of FLUoxetine (PROZAC) 10 MG tablet to capsules. [facility] said they would make the change for the patient but wanted this noted in her chart. No need to return her call unless you have questions.       Routing to Ria Comment, FNP as Lorain Childes. Will close the encounter.

## 2015-11-24 NOTE — Telephone Encounter (Signed)
Mychart message has been sent to the patient notifying her of prior authorization approval of her Fluoxetine rx. This has been approved from 11/23/2015-11/22/2016.   Routing to provider for final review. Patient agreeable to disposition. Will close encounter.

## 2015-11-24 NOTE — Telephone Encounter (Signed)
See previous note, closed by accident.

## 2015-11-24 NOTE — Telephone Encounter (Signed)
Amy Barr at [facility] calling requesting a change of FLUoxetine (PROZAC) 10 MG tablet to capsules. [facility] said they would make the change for the patient but wanted this noted in her chart. No need to return her call unless you have questions.

## 2015-12-17 HISTORY — PX: COLONOSCOPY: SHX174

## 2016-01-19 ENCOUNTER — Other Ambulatory Visit: Payer: Self-pay | Admitting: Nurse Practitioner

## 2016-01-20 NOTE — Telephone Encounter (Signed)
09/20/2015 Estradiol, Prozac and Medroxyprogesterone sent to [facility] #90/4 rfs- rx refused.

## 2016-02-23 ENCOUNTER — Other Ambulatory Visit: Payer: Self-pay | Admitting: Nurse Practitioner

## 2016-02-23 NOTE — Telephone Encounter (Deleted)
Medication refill request: Fluoxetine 10 mg ; Estradiol 0.5 mg  Last AEX:  09/20/2015 with PG Next AEX: 11/14/2016 with PG Last MMG (if hormonal medication request): 04/23/2015 BI-RADS 1: NEG Refill authorized: #90/3 rfs

## 2016-02-23 NOTE — Telephone Encounter (Signed)
09/20/2015 #90/4 rfs for both [facility]-rx refused.

## 2016-02-26 ENCOUNTER — Other Ambulatory Visit: Payer: Self-pay | Admitting: Nurse Practitioner

## 2016-02-28 NOTE — Telephone Encounter (Signed)
09/20/2015 #90/4 rfs was sent to [facility] patient should still have refills until November 2017.

## 2016-03-19 ENCOUNTER — Encounter: Payer: Self-pay | Admitting: Nurse Practitioner

## 2016-03-20 ENCOUNTER — Telehealth: Payer: Self-pay | Admitting: Nurse Practitioner

## 2016-03-20 NOTE — Telephone Encounter (Signed)
See pt e-mail and concerns about PMB.  She is called and left her a message to call and speak to triage nurse on Tuesday am.  Also sent message to Dr, Edward JollySilva to see if she can work her into her schedule on Tuesday.

## 2016-03-21 ENCOUNTER — Encounter: Payer: Self-pay | Admitting: Obstetrics and Gynecology

## 2016-03-21 ENCOUNTER — Ambulatory Visit (INDEPENDENT_AMBULATORY_CARE_PROVIDER_SITE_OTHER): Payer: 59 | Admitting: Obstetrics and Gynecology

## 2016-03-21 ENCOUNTER — Telehealth: Payer: Self-pay | Admitting: Emergency Medicine

## 2016-03-21 VITALS — BP 110/70 | HR 72 | Resp 16 | Wt 150.0 lb

## 2016-03-21 DIAGNOSIS — K5900 Constipation, unspecified: Secondary | ICD-10-CM

## 2016-03-21 DIAGNOSIS — R102 Pelvic and perineal pain: Secondary | ICD-10-CM

## 2016-03-21 DIAGNOSIS — N950 Postmenopausal bleeding: Secondary | ICD-10-CM | POA: Diagnosis not present

## 2016-03-21 DIAGNOSIS — R1030 Lower abdominal pain, unspecified: Secondary | ICD-10-CM | POA: Diagnosis not present

## 2016-03-21 NOTE — Telephone Encounter (Signed)
Call to patient. She received phone call from Ria CommentPatricia Grubb, FNP. She has not missed any doses of HRT.  Started having some vaginal bleeding on Saturday. She is wearing a panty liner only and changing coverage q 3 hours.  Having some lower back pain and abdominal cramping. Not using OTC meds. Office visit today at 1300 with Dr. Oscar LaJertson.  Patient advised to call back or seek emergency care if bleeding or pain increases patient agreeable. Routing to provider for final review. Patient agreeable to disposition. Will close encounter.    Cc Ria CommentPatricia Grubb, FNP.

## 2016-03-21 NOTE — Telephone Encounter (Signed)
Chief Complaint  Patient presents with  . Female GU Problem    Patient sent mychart message    RE: Non-Urgent Medical Question     From  Ria CommentPatricia Grubb, FNP    To  Amy Barr    Sent and Delivered  03/20/2016 11:02 AM      Last Read in MyChart  Not Read     [name], I tried to call you as well as I wanted to find out if you have missed/ skipped doses of the HRT medication. That could have caused the bleeding. Also need to know how much bleeding.  Either way we do need to here from you in the morning as you need to be evaluated. May need to do a biopsy of the uterine lining. Please cal and ask for phone triage nurse.          Previous Messages    ----- Message -----   FromDelena Barr: Amy Barr,Amy Barr   Sent: 03/19/2016 1:03 PM EDT    To: Ria CommentPatricia Grubb, FNP  Subject: Non-Urgent Medical Question   Happy Memorial Day and Hello -   I started a period on Saturday, 03/18/16. I have been in menopause for 2 years and my last normal period was July 2014. I need some assistance in this matter as this is rather scary and startling for me.   Please contact me at your earliest convenience once you return from the holiday weekend.   Thank you - Amy ServeBarbara Barr        Non-Urgent Medical Question     From  Amy Barr    To  Ria CommentPatricia Grubb, FNP    Sent  03/19/2016 1:03 PM     Happy Memorial Day and Hello -   I started a period on Saturday, 03/18/16. I have been in menopause for 2 years and my last normal period was July 2014. I need some assistance in this matter as this is rather scary and startling for me.   Please contact me at your earliest convenience once you return from the holiday weekend.   Thank you - Amy ServeBarbara Barr

## 2016-03-21 NOTE — Progress Notes (Signed)
Patient ID: Amy Barr, female   DOB: 06-03-1966, 50 y.o.   MRN: [id number] GYNECOLOGY  VISIT   HPI: 50 y.o.   Married  Caucasian  female   G2P0020 with Patient's last menstrual period was 05/12/2014 (exact date).   here c/o PMB  That started 3 days ago. She is on daily HRT, no missed or late doses. The bleeding is light, not soaking through a panty liner. She also c/o lower back pain for 1.5 weeks, started having BLQ abdominal pain, R>L. The pain is intermittent, lasts for minutes, crampy, up to a 4/10 in severity. She also c/o a mild headache. No fevers, chills, nausea, emesis, diarrhea, or urinary c/o. Mild constipation over the last days. Last BM was yesterday. No recent intercourse.   GYNECOLOGIC HISTORY: Patient's last menstrual period was 05/12/2014 (exact date). Contraception:postmenopause Menopausal hormone therapy: Estradiol/ Provera         OB History    Gravida Para Term Preterm AB TAB SAB Ectopic Multiple Living   2 0   0         There are no active problems to display for this patient.   Past Medical History  Diagnosis Date  . Migraine   . Anxiety   . Depression     Past Surgical History  Procedure Laterality Date  . Rotator cuff repair Right 1992  . Carpal tunnel release Right 2010  . Nasal sinus surgery  2001  . Mandible surgery  1991    Current Outpatient Prescriptions  Medication Sig Dispense Refill  . b complex vitamins tablet Take 1 tablet by mouth daily.    . chlorproMAZINE (THORAZINE) 25 MG tablet Take 1-2 tablets by mouth as needed.  1  . estradiol (ESTRACE) 0.5 MG tablet Take 1 tablet (0.5 mg total) by mouth daily. 90 tablet 4  . FLUoxetine (PROZAC) 10 MG tablet TAKE 1 TABLET (10 MG TOTAL) BY MOUTH DAILY. 90 tablet 4  . medroxyPROGESTERone (PROVERA) 5 MG tablet Take 1 tablet (5 mg total) by mouth daily. 90 tablet 4  . Melatonin 5 MG CAPS Take by mouth.    . Misc Natural Products (OSTEO BI-FLEX ADV DOUBLE ST PO) Take by mouth.    .  Multiple Vitamin (MULTIVITAMIN) capsule Take 1 capsule by mouth daily.    . Omega-3 Fatty Acids (FISH OIL) 1000 MG CAPS Take by mouth.    Marland Kitchen omeprazole (PRILOSEC) 40 MG capsule Take 40 mg by mouth daily.    . Probiotic Product (PHILLIPS COLON HEALTH PO) Take by mouth.    . thiamine (VITAMIN B-1) 100 MG tablet Take 100 mg by mouth daily.    Marland Kitchen topiramate (TOPAMAX) 200 MG tablet Take 2 tablets by mouth daily.    Marland Kitchen zolmitriptan (ZOMIG) 5 MG nasal solution Place into the nose as needed for migraine.     No current facility-administered medications for this visit.     ALLERGIES: Percocet  Family History  Problem Relation Age of Onset  . Alzheimer's disease Mother   . Hyperlipidemia Mother   . Thyroid disease Mother   . Diabetes Father   . Stroke Father     Social History   Social History  . Marital Status: Married    Spouse Name: N/A  . Number of Children: 0  . Years of Education: N/A   Occupational History  .     Social History Main Topics  . Smoking status: Former Smoker -- 1.00 packs/day for 15 years  Quit date: 12/29/2011  . Smokeless tobacco: Never Used  . Alcohol Use: 0.0 oz/week    7-8 Glasses of wine per week  . Drug Use: No  . Sexual Activity:    Partners: Male    Birth Control/ Protection: None   Other Topics Concern  . Not on file   Social History Narrative    Review of Systems  Constitutional: Negative.   HENT: Negative.   Eyes: Negative.   Respiratory: Negative.   Cardiovascular: Negative.   Gastrointestinal: Negative.   Genitourinary:       Postmenopause bleeding  Musculoskeletal: Negative.   Skin: Negative.   Neurological: Negative.   Endo/Heme/Allergies: Negative.   Psychiatric/Behavioral: Negative.     PHYSICAL EXAMINATION:    BP 110/70 mmHg  Pulse 72  Resp 16  Wt 150 lb (68.04 kg)  LMP 05/12/2014 (Exact Date)    General appearance: alert, cooperative and appears stated age Abdomen: soft, mildly tender in the RLQ, no rebound, no  guarding, no masses, mildly distended  Pelvic: External genitalia:  no lesions              Urethra:  normal appearing urethra with no masses, tenderness or lesions              Bartholins and Skenes: normal                 Vagina: normal appearing vagina with normal color and discharge, no lesions              Cervix: no cervical motion tenderness and no lesions              Bimanual Exam:  Uterus:  normal size, contour, position, consistency, mobility, non-tender              Adnexa: no masses, mildly tender in the right adnexa              Rectovaginal: Yes.  .  Confirms.              Anus:  normal sphincter tone, no lesions  The risks of endometrial biopsy were reviewed and a consent was obtained.  A speculum was placed in the vagina and the cervix was cleansed with betadine. The mini-pipelle was placed into the endometrial cavity. The uterus sounded to 7 cm. The endometrial biopsy was performed, moderate blood/tissue was obtained. The speculum was removed. There were no complications.    Chaperone was present for exam.  ASSESSMENT PMP female on continuous HRT with bleeding Abdominal pain, R>L Mild constipaiton    PLAN Endometrial biopsy done F/U for an ultrasound Ibuprofen for pain (800 mg given) Miralax or metamucil for constipation   An After Visit Summary was printed and given to the patient.

## 2016-03-21 NOTE — Telephone Encounter (Signed)
New telephone encounter opened and patient scheduled for office visit today with Dr. Oscar LaJertson.

## 2016-03-22 ENCOUNTER — Telehealth: Payer: Self-pay | Admitting: Obstetrics and Gynecology

## 2016-03-22 NOTE — Telephone Encounter (Signed)
Called patient to review benefits for a recommended procedure. Left Voicemail requesting a call back. °

## 2016-03-26 ENCOUNTER — Encounter: Payer: Self-pay | Admitting: Nurse Practitioner

## 2016-03-27 ENCOUNTER — Telehealth: Payer: Self-pay

## 2016-03-27 ENCOUNTER — Other Ambulatory Visit: Payer: Self-pay | Admitting: Nurse Practitioner

## 2016-03-27 NOTE — Telephone Encounter (Signed)
Please call in one month, she is being evaluated for PMP bleeding.

## 2016-03-27 NOTE — Telephone Encounter (Signed)
Telephone encounter created to discuss mychart message with Dr.Jertson. 

## 2016-03-27 NOTE — Telephone Encounter (Signed)
Non-Urgent Medical Question  Message [phone]   From  Delena ServeBarbara Warman   To  Ria CommentPatricia Grubb, FNP   Sent  03/26/2016 7:58 PM     Patty -   Hello! Since you are the prescriber on the Estradiol and the Medroxyprogesterone, I am reaching out to you, as I will run out on Wednesday.   Please either refer me to Dr. Kristie CowmanJertsen, who I will see on Tuesday for an ultrasound, or please send over the prescriptions to [facility], in [place].   Thank you so much for you for your assistance and have a great week!   Delena ServeBarbara Riano      Responsible Party    Pool - [facility] Clinical Pool No one has taken responsibility for this message.     No actions have been taken on this message.     Routing to Dr.Jertson for review and advise as patient was seen with Dr.Jertson for PMB on 03/21/2016. She is scheduled for PUS on 03/28/2016 with Dr.Jertson.

## 2016-03-28 ENCOUNTER — Encounter: Payer: Self-pay | Admitting: Obstetrics and Gynecology

## 2016-03-28 ENCOUNTER — Ambulatory Visit (INDEPENDENT_AMBULATORY_CARE_PROVIDER_SITE_OTHER): Payer: 59

## 2016-03-28 ENCOUNTER — Ambulatory Visit (INDEPENDENT_AMBULATORY_CARE_PROVIDER_SITE_OTHER): Payer: 59 | Admitting: Obstetrics and Gynecology

## 2016-03-28 VITALS — BP 102/60 | HR 64 | Resp 14 | Wt 150.0 lb

## 2016-03-28 DIAGNOSIS — R1030 Lower abdominal pain, unspecified: Secondary | ICD-10-CM

## 2016-03-28 DIAGNOSIS — N950 Postmenopausal bleeding: Secondary | ICD-10-CM

## 2016-03-28 DIAGNOSIS — K5900 Constipation, unspecified: Secondary | ICD-10-CM | POA: Diagnosis not present

## 2016-03-28 MED ORDER — ESTRADIOL 0.5 MG PO TABS: 0.5000 mg | ORAL_TABLET | Freq: Every day | ORAL | Status: DC

## 2016-03-28 MED ORDER — MEDROXYPROGESTERONE ACETATE 5 MG PO TABS: 5.0000 mg | ORAL_TABLET | Freq: Every day | ORAL | Status: DC

## 2016-03-28 NOTE — Progress Notes (Signed)
GYNECOLOGY  VISIT   HPI: 50 y.o.   Married  Caucasian  female   G2P0020 with Patient's last menstrual period was 05/12/2014 (exact date).   here for Pelvic U/S. The patient is being evaluated for PMP bleeding and BLQ abdominal pain, R>L. Endometrial biopsy was benign, degenerating endometrium. She is on daily HRT with 5 mg of daily provera. At her visit last week she was also having issues with constipation. She tried the miralax for 3-4 days, now having daily BM. Her abdominal pain has resolved. She is still having some back pain, thinks it's from how she is sitting at work.   GYNECOLOGIC HISTORY: Patient's last menstrual period was 05/12/2014 (exact date). Contraception:postmenopause Menopausal hormone therapy: Estrace/Provera        OB History    Gravida Para Term Preterm AB TAB SAB Ectopic Multiple Living   2 0   0         There are no active problems to display for this patient.   Past Medical History  Diagnosis Date  . Migraine   . Anxiety   . Depression     Past Surgical History  Procedure Laterality Date  . Rotator cuff repair Right 1992  . Carpal tunnel release Right 2010  . Nasal sinus surgery  2001  . Mandible surgery  1991    Current Outpatient Prescriptions  Medication Sig Dispense Refill  . b complex vitamins tablet Take 1 tablet by mouth daily.    . chlorproMAZINE (THORAZINE) 25 MG tablet Take 1-2 tablets by mouth as needed.  1  . estradiol (ESTRACE) 0.5 MG tablet Take 1 tablet (0.5 mg total) by mouth daily. 30 tablet 0  . FLUoxetine (PROZAC) 10 MG tablet TAKE 1 TABLET (10 MG TOTAL) BY MOUTH DAILY. 90 tablet 4  . medroxyPROGESTERone (PROVERA) 5 MG tablet Take 1 tablet (5 mg total) by mouth daily. 30 tablet 0  . Melatonin 5 MG CAPS Take by mouth.    . Misc Natural Products (OSTEO BI-FLEX ADV DOUBLE ST PO) Take by mouth.    . Multiple Vitamin (MULTIVITAMIN) capsule Take 1 capsule by mouth daily.    . Omega-3 Fatty Acids (FISH OIL) 1000 MG  CAPS Take by mouth.    Marland Kitchen omeprazole (PRILOSEC) 40 MG capsule Take 40 mg by mouth daily.    . Probiotic Product (PHILLIPS COLON HEALTH PO) Take by mouth.    . thiamine (VITAMIN B-1) 100 MG tablet Take 100 mg by mouth daily.    Marland Kitchen topiramate (TOPAMAX) 200 MG tablet Take 2 tablets by mouth daily.    Marland Kitchen zolmitriptan (ZOMIG) 5 MG nasal solution Place into the nose as needed for migraine.     No current facility-administered medications for this visit.     ALLERGIES: Percocet  Family History  Problem Relation Age of Onset  . Alzheimer's disease Mother   . Hyperlipidemia Mother   . Thyroid disease Mother   . Diabetes Father   . Stroke Father     Social History   Social History  . Marital Status: Married    Spouse Name: N/A  . Number of Children: 0  . Years of Education: N/A   Occupational History  .     Social History Main Topics  . Smoking status: Former Smoker -- 1.00 packs/day for 15 years    Quit date: 12/29/2011  . Smokeless tobacco: Never Used  . Alcohol Use: 0.0 oz/week    7-8  Glasses of wine per week  . Drug Use: No  . Sexual Activity:    Partners: Male    Birth Control/ Protection: None   Other Topics Concern  . Not on file   Social History Narrative    Review of Systems  Constitutional: Negative.   HENT: Negative.   Eyes: Negative.   Respiratory: Negative.   Cardiovascular: Negative.   Gastrointestinal: Negative.   Genitourinary: Negative.   Musculoskeletal: Negative.   Skin: Negative.   Neurological: Negative.   Endo/Heme/Allergies: Negative.   Psychiatric/Behavioral: Negative.     PHYSICAL EXAMINATION:    BP 102/60 mmHg  Pulse 64  Resp 14  Wt 150 lb (68.04 kg)  LMP 05/12/2014 (Exact Date)    General appearance: alert, cooperative and appears stated age  Ultrasound pictures reviewed with the patient  ASSESSMENT Postmenopausal bleeding, normal GYN ultrasound with thin endometrial stripe, endometrial biopsy benign, degenerating  endometrium HRT, she is on daily HRT, no skipped doses. I would increase her daily progesterone, but she is already on 5 mg of provera daily Abdominal pain has improved Constipation improved with miralax   PLAN Call with recurrent bleeding Will give her enough HRT to get to her annual exam (already scheduled with Gilford Silviusebbie Grubb) Miralax as needed for constipation   An After Visit Summary was printed and given to the patient.

## 2016-03-28 NOTE — Telephone Encounter (Signed)
Rx for Estradiol 0.5 mg #30 0RF and Provera 5 mg #30 0RF sent to [facility] file. Left detailed message at number provided [phone], okay per ROI. Advised 1 month supply of Estradiol and Provera have been sent to [facility] until evaluation for PMB is complete. Advised to return call with any further questions.  Routing to provider for final review. Patient agreeable to disposition. Will close encounter.

## 2016-03-31 ENCOUNTER — Telehealth: Payer: Self-pay | Admitting: *Deleted

## 2016-03-31 NOTE — Telephone Encounter (Signed)
Spoke with patient -see result note -eh 

## 2016-03-31 NOTE — Telephone Encounter (Signed)
Let message to call in regards to my chart message -eh

## 2016-03-31 NOTE — Telephone Encounter (Signed)
Patient returned call

## 2016-04-24 ENCOUNTER — Other Ambulatory Visit: Payer: Self-pay | Admitting: Nurse Practitioner

## 2016-04-24 NOTE — Telephone Encounter (Signed)
Medication refill request: Fluoxetine 10 mg Last AEX:  09/20/15 PG Next AEX: 11/14/16 Last MMG (if hormonal medication request): 04/23/15 [id number]  Refill authorized: 09/20/15. #[id number]. Please advise. Thank you.

## 2016-07-23 ENCOUNTER — Encounter: Payer: Self-pay | Admitting: Nurse Practitioner

## 2016-07-24 ENCOUNTER — Other Ambulatory Visit: Payer: Self-pay | Admitting: Nurse Practitioner

## 2016-07-24 ENCOUNTER — Telehealth: Payer: Self-pay

## 2016-07-24 ENCOUNTER — Other Ambulatory Visit: Payer: Self-pay | Admitting: Obstetrics and Gynecology

## 2016-07-24 MED ORDER — ESTRADIOL 0.5 MG PO TABS: 0.5000 mg | ORAL_TABLET | Freq: Every day | ORAL | 1 refills | Status: DC

## 2016-07-24 MED ORDER — FLUOXETINE HCL 10 MG PO CAPS: 10.0000 mg | ORAL_CAPSULE | Freq: Every day | ORAL | 1 refills | Status: DC

## 2016-07-24 MED ORDER — MEDROXYPROGESTERONE ACETATE 5 MG PO TABS: 5.0000 mg | ORAL_TABLET | Freq: Every day | ORAL | 1 refills | Status: DC

## 2016-07-24 NOTE — Telephone Encounter (Signed)
Patient was seen for last aex on 09/18/2015. Last seen in office on 03/28/2016 for evaluation of PMB with PUS which showed degenerative endometrium. EMB was benign. Patient is currently on Provera 5 mg daily, Estradiol 0.5 mg daily, and Prozac 10 mg daily. Next aex is scheduled for 10/2016 with Amy CommentPatricia Grubb, FNP.    Non-Urgent Medical Question  Message [phone]  From Amy ServeBarbara Morici To Amy CommentPatricia Grubb, FNP Sent 07/23/2016 7:15 PM  Hello -   I am now out of my estradiol & medroxprogesone. Next month I will be out of my fluoxetine. I am due to come in or my yearly in January 2018.   Please renew prior to me coming in. If you need labs, reach out to [facility] for some that have been done in Sept. 2017.   Thank you -   Amy Barr   Responsible Party   Pool - [facility] Clinical Pool No one has taken responsibility for this message.  No actions have been taken on this message.    Dr.Jertson, okay to refill Estradiol, Provera, and Prozac at this time until her aex?

## 2016-07-24 NOTE — Telephone Encounter (Signed)
Yes, please refill her medications until her annual exam

## 2016-07-24 NOTE — Telephone Encounter (Signed)
Left message to call Joaovictor Krone at [phone].  Rx for Estradiol 0.5 mg take 1 tablet daily #90 1RF, Provera 5 mg take 1 tablet daily #90 1RF, Fluoxetine 10 mg take 1 tablet daily #90 1RF sent to [facility] on file.

## 2016-07-24 NOTE — Telephone Encounter (Signed)
Telephone encounter created to discuss with Dr.Jertson as Ria CommentPatricia Grubb, FNP is out of the office today. Please see telephone encounter dated 07/24/2016.

## 2016-07-26 NOTE — Telephone Encounter (Signed)
Spoke with patient. Advised her Estradiol, Provera, and Fluoxetine prescriptions have been sent to her pharmacy on file with enough refills until her aex. She is agreeable and verbalizes understanding.  Routing to provider for final review. Patient agreeable to disposition. Will close encounter.

## 2016-07-26 NOTE — Telephone Encounter (Signed)
Patient returning call. Ok to call work number [phone]

## 2016-09-23 ENCOUNTER — Other Ambulatory Visit: Payer: Self-pay | Admitting: Obstetrics and Gynecology

## 2016-11-14 ENCOUNTER — Ambulatory Visit (INDEPENDENT_AMBULATORY_CARE_PROVIDER_SITE_OTHER): Payer: 59 | Admitting: Nurse Practitioner

## 2016-11-14 ENCOUNTER — Telehealth: Payer: Self-pay | Admitting: Nurse Practitioner

## 2016-11-14 ENCOUNTER — Encounter: Payer: Self-pay | Admitting: Nurse Practitioner

## 2016-11-14 VITALS — BP 120/86 | HR 60 | Ht 62.0 in | Wt 152.0 lb

## 2016-11-14 DIAGNOSIS — Z0000 Encounter for general adult medical examination without abnormal findings: Secondary | ICD-10-CM

## 2016-11-14 DIAGNOSIS — Z01411 Encounter for gynecological examination (general) (routine) with abnormal findings: Secondary | ICD-10-CM | POA: Diagnosis not present

## 2016-11-14 DIAGNOSIS — Z1231 Encounter for screening mammogram for malignant neoplasm of breast: Secondary | ICD-10-CM | POA: Diagnosis not present

## 2016-11-14 DIAGNOSIS — N950 Postmenopausal bleeding: Secondary | ICD-10-CM

## 2016-11-14 DIAGNOSIS — Z1239 Encounter for other screening for malignant neoplasm of breast: Secondary | ICD-10-CM

## 2016-11-14 LAB — POCT URINALYSIS DIPSTICK
Bilirubin, UA: NEGATIVE
Blood, UA: NEGATIVE
Glucose, UA: NEGATIVE
Ketones, UA: NEGATIVE
LEUKOCYTES UA: NEGATIVE
NITRITE UA: NEGATIVE
PROTEIN UA: NEGATIVE
UROBILINOGEN UA: NEGATIVE
pH, UA: 7

## 2016-11-14 MED ORDER — ESTRADIOL 0.5 MG PO TABS: 0.5000 mg | ORAL_TABLET | Freq: Every day | ORAL | 0 refills | Status: DC

## 2016-11-14 MED ORDER — MEDROXYPROGESTERONE ACETATE 5 MG PO TABS: 5.0000 mg | ORAL_TABLET | Freq: Every day | ORAL | 0 refills | Status: DC

## 2016-11-14 MED ORDER — FLUOXETINE HCL 10 MG PO CAPS: 10.0000 mg | ORAL_CAPSULE | Freq: Every day | ORAL | 4 refills | Status: DC

## 2016-11-14 NOTE — Telephone Encounter (Signed)
Left message on answering machine per DPR.  She was informed that I did discuss her care and PMB with Dr. Oscar LaJertson and she does need evaluation for this within a month. (her time is very restricted with husbands illness and caring for him at the hospital.) Offered that perhaps while in [place] on 11[place]22/16 that we can get her back in here on the same day.  She is informed to call back and discuss.

## 2016-11-14 NOTE — Progress Notes (Signed)
Scheduled patient while in office for [facility]22/2016 at 9:40 am. Patient is agreeable to date and time.

## 2016-11-14 NOTE — Progress Notes (Signed)
Patient ID: Amy ServeBarbara Barr, female   DOB: 10/17/1966, 51 y.o.   MRN: [id number]  51 y.o. [id number] Married  Caucasian Fe here for annual exam.  Husband is in the hospital with an infection.  This past Sunday had PMB on Sunday am that was a black clot.  Small amount on Monday and today with wiping.  The last episode in may was at same time cat had renal failure and died and now this episode thinks is related to husband health issue. No missed pills or late HRT.  Patient's last menstrual period was 05/12/2014 (exact date).          Sexually active: Yes.    The current method of family planning is post menopausal status.    Exercising: Yes.    Gym/ health club routine includes aerobic exercise, treadmill, elliptical and some weights. Smoker:  no  Health Maintenance: Pap: 09/20/15, Negative with negative HR HPV  MMG: 04/23/15, Bi-Rads 1: Negative, did not have in 2017, wants to wait until July to complete Colonoscopy: 12/17/15, Tubular Adenoma and hyperplastic polyp, repeat in 5 years BMD: Never TDaP:  07/24/11 Hep C and HIV: 09/20/15 Labs: declined, had with migraine doctor 3 months ago  Urine: neg   reports that she quit smoking about 4 years ago. She has a 15.00 pack-year smoking history. She has never used smokeless tobacco. She reports that she drinks about 4.2 - 7.2 oz of alcohol per week . She reports that she does not use drugs.  Past Medical History:  Diagnosis Date  . Anxiety   . Depression   . Migraine     Past Surgical History:  Procedure Laterality Date  . CARPAL TUNNEL RELEASE Right 2010  . MANDIBLE SURGERY  1991  . NASAL SINUS SURGERY  2001  . ROTATOR CUFF REPAIR Right 1992    Current Outpatient Prescriptions  Medication Sig Dispense Refill  . b complex vitamins tablet Take 1 tablet by mouth daily.    . chlorproMAZINE (THORAZINE) 25 MG tablet Take 1-2 tablets by mouth as needed.  1  . estradiol (ESTRACE) 0.5 MG tablet Take 1 tablet (0.5 mg total) by mouth daily. 90 tablet  1  . FLUoxetine (PROZAC) 10 MG capsule Take 1 capsule (10 mg total) by mouth daily. 90 capsule 1  . medroxyPROGESTERone (PROVERA) 5 MG tablet Take 1 tablet (5 mg total) by mouth daily. 90 tablet 1  . Melatonin 5 MG CAPS Take by mouth.    . Misc Natural Products (OSTEO BI-FLEX ADV DOUBLE ST PO) Take by mouth.    . Multiple Vitamin (MULTIVITAMIN) capsule Take 1 capsule by mouth daily.    . Omega-3 Fatty Acids (FISH OIL) 1000 MG CAPS Take by mouth.    Marland Kitchen. omeprazole (PRILOSEC) 40 MG capsule Take 40 mg by mouth daily.    . Probiotic Product (PHILLIPS COLON HEALTH PO) Take by mouth.    . thiamine (VITAMIN B-1) 100 MG tablet Take 100 mg by mouth daily.    Marland Kitchen. topiramate (TOPAMAX) 200 MG tablet Take 2 tablets by mouth daily.    Marland Kitchen. zolmitriptan (ZOMIG) 5 MG nasal solution Place into the nose as needed for migraine.     No current facility-administered medications for this visit.     Family History  Problem Relation Age of Onset  . Alzheimer's disease Mother   . Hyperlipidemia Mother   . Thyroid disease Mother   . Diabetes Father   . Stroke Father     ROS:  Pertinent items  are noted in HPI.  Otherwise, a comprehensive ROS was negative.  Exam:   BP 120/86 (BP Location: Right Arm, Patient Position: Sitting, Cuff Size: Normal)   Pulse 60   Ht 5\' 2"  (1.575 m)   Wt 152 lb (68.9 kg)   LMP 05/12/2014 (Exact Date)   BMI 27.80 kg/m  Height: 5\' 2"  (157.5 cm) Ht Readings from Last 3 Encounters:  11/14/16 5\' 2"  (1.575 m)  09/20/15 5' 2.25" (1.581 m)  09/08/14 5\' 3"  (1.6 m)    General appearance: alert, cooperative and appears stated age Head: Normocephalic, without obvious abnormality, atraumatic Neck: no adenopathy, supple, symmetrical, trachea midline and thyroid normal to inspection and palpation Lungs: clear to auscultation bilaterally Breasts: normal appearance, no masses or tenderness Heart: regular rate and rhythm Abdomen: soft, non-tender; no masses,  no organomegaly Extremities:  extremities normal, atraumatic, no cyanosis or edema Skin: Skin color, texture, turgor normal. No rashes or lesions Lymph nodes: Cervical, supraclavicular, and axillary nodes normal. No abnormal inguinal nodes palpated Neurologic: Grossly normal   Pelvic: External genitalia:  no lesions              Urethra:  normal appearing urethra with no masses, tenderness or lesions              Bartholin's and Skene's: normal                 Vagina: normal appearing vagina with normal color and discharge, no lesions              Cervix: anteverted              Pap taken: No. Bimanual Exam:  Uterus:  normal size, contour, position, consistency, mobility, non-tender              Adnexa: no mass, fullness, tenderness               Rectovaginal: Confirms               Anus:  normal sphincter tone, no lesions  Chaperone present: no  A:  Well Woman with normal exam  Postmenopausal - starting HRT 03/2014 - 08/30/14 Injury to left lower leg 08/30/14, back on HRT 11/2014 - present Elevated cholesterol - ? related to [facility] or HRT PMS/ menopausal mood changes  PMB 03/21/16 with negative evaluation (stress with cat in renal failure and died)  Another episode of PMB 11/15/2016 (stress with husband in hospital with flesh eating bacteria)    P:   Reviewed health and wellness pertinent to exam  Pap smear as above  Mammogram is past due and will schedule today  Refill on HRT only X 1 month until Mammo is done - will get scheduled today  Refill on Prozac X 1 yr.    Will discuss with Dr. Oscar La and decide when evaluation can be done due to pt tight schedule caring for her husband.  Discussed when Dr. Oscar La came back from OR and needs eval within a month.  PC with information is made and message is left for her to call back.  Counseled on breast self exam, mammography screening, use and side effects of HRT, adequate intake of calcium and vitamin D, diet and exercise, Kegel's  exercises return annually or prn  An After Visit Summary was printed and given to the patient.

## 2016-11-14 NOTE — Patient Instructions (Signed)

## 2016-11-14 NOTE — Telephone Encounter (Signed)
Patient did call back and understands that she needs evaluation with Dr. Oscar LaJertson.  The idea and getting this done on 11/22/16 seemed OK with her as she will be in town for Mammogram that day.  She still is unsure what the status of her husband will be at that time and would very much appreciate a call back prior to apt to confirm everything.  She is just so stressed right now, she thinks she may forget.  The order for EMB will be sent for insurance approval to [employer].  She can then set up apt and hopefully be able to call pt prior to exam.

## 2016-11-16 ENCOUNTER — Telehealth: Payer: Self-pay | Admitting: Nurse Practitioner

## 2016-11-16 DIAGNOSIS — N950 Postmenopausal bleeding: Secondary | ICD-10-CM

## 2016-11-16 NOTE — Telephone Encounter (Signed)
Patient returned call. Conveyed to patient benefits for recommended endometrial biopsy. Patient understood and is agreeable to information presented.  Patient wants to verify if the endometrial biopsy will be performed at the same time as the evaluation with Dr Oscar LaJertson.  Patient also states, she "is sitting in the hospital now with her husband and is not even sure of a release date".  She is unsure if scheduling the appointment on 11/22/16 will even be an option, states everything is pending on how her husband is doing.  I advised patient I will ask Ria Commentatricia Grubb, if the procedure can be performed the same day as evaluation and will confirm if scheduling at a later will be acceptable.  Patient is agreeable to a return call.  Routing to Walt DisneyPatricia Grubb

## 2016-11-16 NOTE — Telephone Encounter (Signed)
Called patient to review benefits for a recommended procedure. Left Voicemail requesting a call back. °

## 2016-11-17 NOTE — Telephone Encounter (Signed)
When I spoke to Dr.Jertson about the PMB - I got the impression she would be seeing her and doing the EMB at same time.  Lets still try to work for the Jan 31 st date.  She is under a lot of stress and trying to make this as easy as can be.  I will send this note to Dr. Oscar LaJertson as well.

## 2016-11-17 NOTE — Telephone Encounter (Signed)
Spoke with patient. Advised of message as seen below from Ria CommentPatricia Grubb, FNP. Patient verbalizes understanding. Patient is unable to schedule for the 31st as she needs an early morning appointment that day and Dr.Jertson will be I surgery. Requesting to wait a couple of weeks due to husbands condition. "I think I will need to reschedule my mammogram and [place] [facility] [place]. I just cannot be away for that long." Requesting early morning appointment due to travel and husbands condition. Appointment scheduled for 12/07/2016 at 8 am with Dr.Jertson. Patient is agreeable to date and time. EMB order placed.  Cc: Dr.Jertson  Routing to provider for final review. Patient agreeable to disposition. Will close encounter.

## 2016-11-17 NOTE — Telephone Encounter (Signed)
Left message to call Kaitlyn at [phone]. 

## 2016-11-19 NOTE — Progress Notes (Signed)
Encounter reviewed by Dr. Brook Amundson C. Silva.  

## 2016-11-22 ENCOUNTER — Ambulatory Visit: Payer: 59

## 2016-11-29 ENCOUNTER — Encounter: Payer: Self-pay | Admitting: Nurse Practitioner

## 2016-12-04 ENCOUNTER — Telehealth: Payer: Self-pay | Admitting: Obstetrics and Gynecology

## 2016-12-04 NOTE — Telephone Encounter (Signed)
Patient cancelled her procedure appointment and will reschedule once her husband is well.

## 2016-12-07 ENCOUNTER — Ambulatory Visit
Admission: RE | Admit: 2016-12-07 | Discharge: 2016-12-07 | Disposition: A | Discharge status: Home / Self Care | Payer: 59 | Source: Ambulatory Visit | Attending: Nurse Practitioner | Admitting: Nurse Practitioner

## 2016-12-07 ENCOUNTER — Ambulatory Visit: Payer: 59 | Admitting: Obstetrics and Gynecology

## 2016-12-07 DIAGNOSIS — Z1231 Encounter for screening mammogram for malignant neoplasm of breast: Secondary | ICD-10-CM

## 2017-01-12 ENCOUNTER — Telehealth: Payer: Self-pay | Admitting: Obstetrics and Gynecology

## 2017-01-12 NOTE — Telephone Encounter (Signed)
Attempted to contact patient to rescheduled cancelled appointment for endometrial biopsy. I was unable to leave a message, due to voicemail being full. Routing to Dr Oscar LaJertson to consider sending a letter  Routing to Dr Oscar LaJertson

## 2017-01-14 NOTE — Telephone Encounter (Signed)
Please send the patient urging her to get evaluation for her postmenopausal bleeding.  Given the difficulty in her coming in for an exam, I would recommend setting her up for an ultrasound, possible sonohysterogram and possible endometrial biopsy. Her husband has been ill, this is probably why she hasn't come in.

## 2017-01-23 NOTE — Telephone Encounter (Signed)
Call to patient. Per ROI can leave detailed message on voice mail.  Left message calling to follow-up on recommended procedure. Left message very important to call back at earliest convenience.

## 2017-01-24 ENCOUNTER — Telehealth: Payer: Self-pay | Admitting: Obstetrics and Gynecology

## 2017-01-24 NOTE — Telephone Encounter (Signed)
Call from patent. Discussed recommendation from Dr Oscar La and stressed significance of evaluation of PMB to rule out abnormal cells. Advised there can also be benign reasons for PMB but must rule out presume abnormal cells until proven otherwise.  Patient states she has very limited availabity due to her ill husband who has just come home from hospital and has to have wet/dry dressing changes 5 times/day. Patient is available this Thursday or Friday. She is also in need of refills on medication and has had MMG. Advised can discuss refill tomorrow but will need evaluation of PMB before refill of HRT.  Appointment scheduled for tomorrow 01-25-17 at 0800. Instructed to take Motrin 800 mg one hour prior with food.   Routing to provider for final review. Patient agreeable to disposition. Will close encounter.

## 2017-01-24 NOTE — Telephone Encounter (Signed)
Patient is scheduled for recommended tests on 01/25/17. Ok to close this encounter

## 2017-01-25 ENCOUNTER — Other Ambulatory Visit: Payer: Self-pay

## 2017-01-25 ENCOUNTER — Other Ambulatory Visit: Payer: Self-pay | Admitting: Obstetrics and Gynecology

## 2017-01-25 ENCOUNTER — Encounter: Payer: Self-pay | Admitting: Obstetrics and Gynecology

## 2017-01-25 ENCOUNTER — Ambulatory Visit (INDEPENDENT_AMBULATORY_CARE_PROVIDER_SITE_OTHER): Payer: 59 | Admitting: Obstetrics and Gynecology

## 2017-01-25 ENCOUNTER — Ambulatory Visit (INDEPENDENT_AMBULATORY_CARE_PROVIDER_SITE_OTHER): Payer: 59

## 2017-01-25 ENCOUNTER — Other Ambulatory Visit: Payer: Self-pay | Admitting: Nurse Practitioner

## 2017-01-25 DIAGNOSIS — N950 Postmenopausal bleeding: Secondary | ICD-10-CM

## 2017-01-25 MED ORDER — MEDROXYPROGESTERONE ACETATE 5 MG PO TABS: 5.0000 mg | ORAL_TABLET | Freq: Every day | ORAL | 0 refills | Status: DC

## 2017-01-25 MED ORDER — ESTRADIOL 0.5 MG PO TABS: 0.5000 mg | ORAL_TABLET | Freq: Every day | ORAL | 0 refills | Status: DC

## 2017-01-25 NOTE — Progress Notes (Signed)
GYNECOLOGY  VISIT   HPI: 51 y.o.   Married  Caucasian  female   G2P0020 with Patient's last menstrual period was 05/12/2014 (exact date).   here for  Follow up PMB The patient was seen last spring with PMP bleeding on HRT. Biopsy was benign, degenerating endometrium. U/S with thin endometrial stripe. She didn't have any further bleeding until the end of January, then she had 2 days of light bleeding. On HRT, no late or missed pills. She is on daily estrace 0.5 mg and provera 5 mg a day.  She hasn't been sexually active secondary to her husbands illness. Her husband was hospitalized with necrotizing fascitis the end of January. He had 9 surgeries and had a large amount of his left thigh removed. Hospitalized for 7 weeks, almost died. He also has diabetes, copd. Ended up on dialysis (short term). Home now. She is changing her dressings multiple times a day, the wound is draining.   GYNECOLOGIC HISTORY: Patient's last menstrual period was 05/12/2014 (exact date). Contraception:postmenopause Menopausal hormone therapy: estradiol/Provera        OB History    Gravida Para Term Preterm AB Living   2 0 0 0 2 0   SAB TAB Ectopic Multiple Live Births   1 1 0 0 0         There are no active problems to display for this patient.   Past Medical History:  Diagnosis Date  . Anxiety   . Depression   . Migraine     Past Surgical History:  Procedure Laterality Date  . CARPAL TUNNEL RELEASE Right 2010  . MANDIBLE SURGERY  1991  . NASAL SINUS SURGERY  2001  . ROTATOR CUFF REPAIR Right 1992    Current Outpatient Prescriptions  Medication Sig Dispense Refill  . b complex vitamins tablet Take 1 tablet by mouth daily.    . chlorproMAZINE (THORAZINE) 25 MG tablet Take 1-2 tablets by mouth as needed.  1  . estradiol (ESTRACE) 0.5 MG tablet Take 1 tablet (0.5 mg total) by mouth daily. 30 tablet 0  . FLUoxetine (PROZAC) 10 MG capsule Take 1 capsule (10 mg total) by mouth daily. 90 capsule 4  .  medroxyPROGESTERone (PROVERA) 5 MG tablet Take 1 tablet (5 mg total) by mouth daily. 30 tablet 0  . Melatonin 5 MG CAPS Take by mouth.    . Misc Natural Products (OSTEO BI-FLEX ADV DOUBLE ST PO) Take by mouth.    . Multiple Vitamin (MULTIVITAMIN) capsule Take 1 capsule by mouth daily.    Marland Kitchen omeprazole (PRILOSEC) 40 MG capsule Take 40 mg by mouth daily.    . Probiotic Product (PHILLIPS COLON HEALTH PO) Take by mouth.    . QUDEXY XR 200 MG CS24 Take 2 capsules by mouth daily.  5  . zolmitriptan (ZOMIG) 5 MG nasal solution Place into the nose as needed for migraine.     No current facility-administered medications for this visit.      ALLERGIES: Percocet [oxycodone-acetaminophen]  Family History  Problem Relation Age of Onset  . Alzheimer's disease Mother   . Hyperlipidemia Mother   . Thyroid disease Mother   . Diabetes Father   . Stroke Father     Social History   Social History  . Marital status: Married    Spouse name: N/A  . Number of children: 0  . Years of education: N/A   Occupational History  .  [employer]   Social History Main Topics  . Smoking status:  Former Smoker    Packs/day: 1.00    Years: 15.00    Quit date: 12/29/2011  . Smokeless tobacco: Never Used  . Alcohol use 4.2 - 7.2 oz/week    7 - 12 Glasses of wine per week  . Drug use: No  . Sexual activity: Yes    Partners: Male    Birth control/ protection: Post-menopausal   Other Topics Concern  . Not on file   Social History Narrative  . No narrative on file    Review of Systems  Constitutional: Negative.   HENT: Negative.   Gastrointestinal: Negative.   Genitourinary: Negative.   Musculoskeletal: Negative.   Skin: Negative.   Neurological: Negative.   Endo/Heme/Allergies: Negative.   Psychiatric/Behavioral: Negative.     PHYSICAL EXAMINATION:    BP 130/70 (BP Location: Right Arm, Patient Position: Sitting, Cuff Size: Normal)   Pulse 64   Resp 14   Wt 153 lb (69.4 kg)   LMP 05/12/2014  (Exact Date)   BMI 27.98 kg/m     General appearance: alert, cooperative and appears stated age  Pelvic: External genitalia:  no lesions              Urethra:  normal appearing urethra with no masses, tenderness or lesions              Bartholins and Skenes: normal                 Vagina: normal appearing vagina with normal color and discharge, no lesions              Cervix: no lesions  Sonohysterogram The procedure and risks of the procedure were reviewed with the patient, consent form was signed. A speculum was placed in the vagina and the cervix was cleansed with betadine. The sonohysterogram catheter was inserted into the uterine cavity without difficulty. Saline was infused under direct observation with the ultrasound. No intracavitary defects were noted in the uterus, but a defect was seen in the cervical canal, c/w a polyp.The catheter was removed.     Chaperone was present for exam.  ASSESSMENT Repeated Postmenopausal bleeding on HRT, sonohysterogram with thin endometrium, but apparent endocervical polyp (not seen on exam)    PLAN Plan: hysteroscopy, possible polypectomy, dilation and curettage.  She will return for a pre-op, needs to schedule this out a few months   An After Visit Summary was printed and given to the patient.  20 minutes face to face time of which over 50% was spent in counseling.

## 2017-01-25 NOTE — Telephone Encounter (Signed)
Patient calling to check the status of the refill request for estradiol and progesterone.

## 2017-01-25 NOTE — Telephone Encounter (Signed)
Medication refill request: Estradiol Last AEX:  11/14/16 PG Next AEX: 11/16/17 PG Last MMG (if hormonal medication request): 12/07/16 BIRADS1, Density B, TBC Refill authorized: 11/14/16 #30 0R. Please advise. Thank you.    Medication refill request: Medroxyprogesterone Last AEX:  11/14/16 PG Next AEX: 11/16/17 PG Last MMG (if hormonal medication request): 12/07/16 BIRADS1, Density B, TBC Refill authorized: 11/14/16 #30 0R. Please advise. Thank you.   Routing to BS since PG is out of the office.

## 2017-01-25 NOTE — Telephone Encounter (Signed)
I am sending this through to Dr. Oscar La who just saw the patient for postmenopausal bleeding.   Cc- Dr. Oscar La, Billie Ruddy

## 2017-01-31 ENCOUNTER — Telehealth: Payer: Self-pay | Admitting: Obstetrics and Gynecology

## 2017-01-31 NOTE — Telephone Encounter (Signed)
Called patient to review benefits for a recommended surgical procedure. Left Voicemail requesting a call back. ° °Routing to Sally Yeakley °

## 2017-02-01 NOTE — Telephone Encounter (Signed)
Returned call to patient. Reviewed benefit for recommended surgery. Patient understood and agreeable. Patient ready to schedule. Patient aware this is professional benefit only. Patient aware will be contacted by hospital for separate benefits. Advised patient the nurse supervisor will contact her for scheduling.  Routing to [employer]

## 2017-02-01 NOTE — Telephone Encounter (Signed)
Patient returned call. She requests a call back at her updated work phone number: [phone], ext [phone].

## 2017-02-06 NOTE — Telephone Encounter (Signed)
Patient is waiting on a call to schedule surgery. She wants to speak with Kennon Rounds.

## 2017-02-06 NOTE — Telephone Encounter (Signed)
Return call to work number, did not leave message since after 5pm. Call to cell number, voice mail confirms "Pricella." Left message returning her call regarding scheduling and will try again tomorrow. She may also call again tomorrow after 830.

## 2017-02-07 NOTE — Telephone Encounter (Signed)
Call to patient. She is unable to talk right now and request call back in 30-45 minutes at work number.

## 2017-02-07 NOTE — Telephone Encounter (Signed)
Patient returning your call states she can be reached on her cell after 5pm today

## 2017-02-07 NOTE — Telephone Encounter (Signed)
Left message to call Gibril Mastro at [phone]. 

## 2017-02-07 NOTE — Telephone Encounter (Signed)
Spoke with patient. Offered multiple surgery dates per Amy Ruddy, RN. Patient would like to proceed with surgery on 03/26/2017. Advised will let Kennon Rounds know so this can be scheduled and she can receive pre-op instructions.

## 2017-02-08 NOTE — Telephone Encounter (Signed)
Case request sent to central scheduling. 

## 2017-02-12 NOTE — Telephone Encounter (Signed)
Return call to patient. Advised of surgery date and tentative time. Surgery instruction sheet reviewed and printed copy will be mailed with surgery center brochure.   Routing to provider for final review. Patient agreeable to disposition. Will close encounter.

## 2017-02-12 NOTE — Telephone Encounter (Signed)
Patient is asking to talk with Sally regarding her upcoming surgery.  °

## 2017-03-07 ENCOUNTER — Ambulatory Visit (INDEPENDENT_AMBULATORY_CARE_PROVIDER_SITE_OTHER): Payer: 59 | Admitting: Obstetrics and Gynecology

## 2017-03-07 ENCOUNTER — Encounter: Payer: Self-pay | Admitting: Obstetrics and Gynecology

## 2017-03-07 VITALS — BP 118/78 | HR 84 | Resp 16 | Wt 151.0 lb

## 2017-03-07 DIAGNOSIS — N950 Postmenopausal bleeding: Secondary | ICD-10-CM

## 2017-03-07 NOTE — Progress Notes (Signed)
GYNECOLOGY  VISIT   HPI: 51 y.o.   Married  Caucasian  female   G2P0020 with Patient's last menstrual period was 05/12/2014 (exact date).   here for surgery consult. Patient is scheduled for [facility] hysteroscopy 03-26-17  The patient was seen last spring with PMP bleeding on HRT. Biopsy was benign, degenerating endometrium. U/S with thin endometrial stripe. She didn't have any further bleeding until the end of January, then she had 2 days of light bleeding. On HRT, no late or missed pills. She is on daily estrace 0.5 mg and provera 5 mg a day.  Sonohysterogram from 01/25/17: no intracavitary defects, but she had a defect in her cervix c/w a polyp. Endometrial stripe was 3.88 mm.    GYNECOLOGIC HISTORY: Patient's last menstrual period was 05/12/2014 (exact date). Contraception:postmenopause  Menopausal hormone therapy: Estrace         OB History    Gravida Para Term Preterm AB Living   2 0 0 0 2 0   SAB TAB Ectopic Multiple Live Births   1 1 0 0 0         There are no active problems to display for this patient.   Past Medical History:  Diagnosis Date  . Anxiety   . Migraine     Past Surgical History:  Procedure Laterality Date  . CARPAL TUNNEL RELEASE Right 2010  . MANDIBLE SURGERY  1991  . NASAL SINUS SURGERY  2001  . ROTATOR CUFF REPAIR Right 1992    Current Outpatient Prescriptions  Medication Sig Dispense Refill  . b complex vitamins tablet Take 1 tablet by mouth daily.    . chlorproMAZINE (THORAZINE) 25 MG tablet Take 1-2 tablets by mouth as needed.  1  . estradiol (ESTRACE) 0.5 MG tablet Take 1 tablet (0.5 mg total) by mouth daily. 30 tablet 0  . FLUoxetine (PROZAC) 10 MG capsule Take 1 capsule (10 mg total) by mouth daily. 90 capsule 4  . medroxyPROGESTERone (PROVERA) 5 MG tablet Take 1 tablet (5 mg total) by mouth daily. 30 tablet 0  . Melatonin 5 MG CAPS Take by mouth.    . Misc Natural Products (OSTEO BI-FLEX ADV DOUBLE ST PO) Take by mouth.    . Multiple Vitamin  (MULTIVITAMIN) capsule Take 1 capsule by mouth daily.    Marland Kitchen omeprazole (PRILOSEC) 40 MG capsule Take 40 mg by mouth daily.    . Probiotic Product (PHILLIPS COLON HEALTH PO) Take by mouth.    . QUDEXY XR 200 MG CS24 Take 2 capsules by mouth daily.  5  . zolmitriptan (ZOMIG) 5 MG nasal solution Place into the nose as needed for migraine.     No current facility-administered medications for this visit.    Qudexy and thorazine for her migraines  ALLERGIES: Percocet [oxycodone-acetaminophen]  Family History  Problem Relation Age of Onset  . Alzheimer's disease Mother   . Hyperlipidemia Mother   . Thyroid disease Mother   . Diabetes Father   . Stroke Father     Social History   Social History  . Marital status: Married    Spouse name: N/A  . Number of children: 0  . Years of education: N/A   Occupational History  .  [employer]   Social History Main Topics  . Smoking status: Former Smoker    Packs/day: 1.00    Years: 15.00    Quit date: 12/29/2011  . Smokeless tobacco: Never Used  . Alcohol use 4.2 - 7.2 oz/week  7 - 12 Glasses of wine per week  . Drug use: No  . Sexual activity: Yes    Partners: Male    Birth control/ protection: Post-menopausal   Other Topics Concern  . Not on file   Social History Narrative  . No narrative on file    Review of Systems  Constitutional: Negative.   HENT: Negative.   Eyes: Negative.   Respiratory: Negative.   Cardiovascular: Negative.   Gastrointestinal: Negative.   Genitourinary: Negative.   Musculoskeletal: Negative.   Skin: Negative.   Neurological: Negative.   Endo/Heme/Allergies: Negative.   Psychiatric/Behavioral: Negative.     PHYSICAL EXAMINATION:    BP 118/78 (BP Location: Right Arm, Patient Position: Sitting, Cuff Size: Normal)   Pulse 84   Resp 16   Wt 151 lb (68.5 kg)   LMP 05/12/2014 (Exact Date)   BMI 27.62 kg/m     General appearance: alert, cooperative and appears stated age Neck: no adenopathy,  supple, symmetrical, trachea midline and thyroid normal to inspection and palpation Heart: regular rate and rhythm Lungs: CTAB Abdomen: soft, non-tender; bowel sounds normal; no masses,  no organomegaly Extremities: normal, atraumatic, no cyanosis Skin: normal color, texture and turgor, no rashes or lesions Lymph: normal cervical supraclavicular and inguinal nodes Neurologic: grossly normal   ASSESSMENT Postmenopausal bleeding, intracervical polyp noted on sonhysterogram    PLAN Plan: hysteroscopy, cervical polypectomy, dilation and curettage. Reviewed risks, including: bleeding, infection, uterine perforation, fluid overload, need for further sugery    An After Visit Summary was printed and given to the patient.

## 2017-03-07 NOTE — Patient Instructions (Signed)
Hysteroscopy  Hysteroscopy is a procedure used for looking inside the womb (uterus). It may be done for various reasons, including:  · To evaluate abnormal bleeding, fibroid (benign, noncancerous) tumors, polyps, scar tissue (adhesions), and possibly cancer of the uterus.  · To look for lumps (tumors) and other uterine growths.  · To look for causes of why a woman cannot get pregnant (infertility), causes of recurrent loss of pregnancy (miscarriages), or a lost intrauterine device (IUD).  · To perform a sterilization by blocking the fallopian tubes from inside the uterus.    In this procedure, a thin, flexible tube with a tiny light and camera on the end of it (hysteroscope) is used to look inside the uterus. A hysteroscopy should be done right after a menstrual period to be sure you are not pregnant.  LET YOUR HEALTH CARE PROVIDER KNOW ABOUT:  · Any allergies you have.  · All medicines you are taking, including vitamins, herbs, eye drops, creams, and over-the-counter medicines.  · Previous problems you or members of your family have had with the use of anesthetics.  · Any blood disorders you have.  · Previous surgeries you have had.  · Medical conditions you have.  RISKS AND COMPLICATIONS  Generally, this is a safe procedure. However, as with any procedure, complications can occur. Possible complications include:  · Putting a hole in the uterus.  · Excessive bleeding.  · Infection.  · Damage to the cervix.  · Injury to other organs.  · Allergic reaction to medicines.  · Too much fluid used in the uterus for the procedure.    BEFORE THE PROCEDURE  · Ask your health care provider about changing or stopping any regular medicines.  · Do not take aspirin or blood thinners for 1 week before the procedure, or as directed by your health care provider. These can cause bleeding.  · If you smoke, do not smoke for 2 weeks before the procedure.  · In some cases, a medicine is placed in the cervix the day before the procedure.  This medicine makes the cervix have a larger opening (dilate). This makes it easier for the instrument to be inserted into the uterus during the procedure.  · Do not eat or drink anything for at least 8 hours before the surgery.  · Arrange for someone to take you home after the procedure.  PROCEDURE  · You may be given a medicine to relax you (sedative). You may also be given one of the following:  ? A medicine that numbs the area around the cervix (local anesthetic).  ? A medicine that makes you sleep through the procedure (general anesthetic).  · The hysteroscope is inserted through the vagina into the uterus. The camera on the hysteroscope sends a picture to a TV screen. This gives the surgeon a good view inside the uterus.  · During the procedure, air or a liquid is put into the uterus, which allows the surgeon to see better.  · Sometimes, tissue is gently scraped from inside the uterus. These tissue samples are sent to a lab for testing.  What to expect after the procedure  · If you had a general anesthetic, you may be groggy for a couple hours after the procedure.  · If you had a local anesthetic, you will be able to go home as soon as you are stable and feel ready.  · You may have some cramping. This normally lasts for a couple days.  · You may   have bleeding, which varies from light spotting for a few days to menstrual-like bleeding for 3-7 days. This is normal.  · If your test results are not back during the visit, make an appointment with your health care provider to find out the results.  This information is not intended to replace advice given to you by your health care provider. Make sure you discuss any questions you have with your health care provider.  Document Released: 01/15/2001 Document Revised: 03/16/2016 Document Reviewed: 05/08/2013  Elsevier Interactive Patient Education © 2017 Elsevier Inc[employer]

## 2017-03-12 NOTE — H&P (Signed)
GYNECOLOGY  VISIT   HPI: 51 y.o.   Married  Caucasian  female   G2P0020 with Patient's last menstrual period was 05/12/2014 (exact date).   here for surgery consult. [facility]  The patient was seen last spring with PMP bleeding on HRT. Biopsy was benign, degenerating endometrium. U/S with thin endometrial stripe. She didn't have any further bleeding until the end of January, then she had 2 days of light bleeding. On HRT, no late or missed pills. She is on daily estrace 0.5 mg and provera 5 mg a day.  Sonohysterogram from 01/25/17: no intracavitary defects, but she had a defect in her cervix c/w a polyp. Endometrial stripe was 3.88 mm.    GYNECOLOGIC HISTORY: Patient's last menstrual period was 05/12/2014 (exact date). Contraception:postmenopause  Menopausal hormone therapy: Estrace                 OB History    Gravida Para Term Preterm AB Living   2 0 0 0 2 0   SAB TAB Ectopic Multiple Live Births   1 1 0 0 0         There are no active problems to display for this patient.       Past Medical History:  Diagnosis Date  . Anxiety   . Migraine          Past Surgical History:  Procedure Laterality Date  . CARPAL TUNNEL RELEASE Right 2010  . MANDIBLE SURGERY  1991  . NASAL SINUS SURGERY  2001  . ROTATOR CUFF REPAIR Right 1992          Current Outpatient Prescriptions  Medication Sig Dispense Refill  . b complex vitamins tablet Take 1 tablet by mouth daily.    . chlorproMAZINE (THORAZINE) 25 MG tablet Take 1-2 tablets by mouth as needed.  1  . estradiol (ESTRACE) 0.5 MG tablet Take 1 tablet (0.5 mg total) by mouth daily. 30 tablet 0  . FLUoxetine (PROZAC) 10 MG capsule Take 1 capsule (10 mg total) by mouth daily. 90 capsule 4  . medroxyPROGESTERone (PROVERA) 5 MG tablet Take 1 tablet (5 mg total) by mouth daily. 30 tablet 0  . Melatonin 5 MG CAPS Take by mouth.    . Misc Natural Products (OSTEO BI-FLEX ADV  DOUBLE ST PO) Take by mouth.    . Multiple Vitamin (MULTIVITAMIN) capsule Take 1 capsule by mouth daily.    Marland Kitchen. omeprazole (PRILOSEC) 40 MG capsule Take 40 mg by mouth daily.    . Probiotic Product (PHILLIPS COLON HEALTH PO) Take by mouth.    . QUDEXY XR 200 MG CS24 Take 2 capsules by mouth daily.  5  . zolmitriptan (ZOMIG) 5 MG nasal solution Place into the nose as needed for migraine.     No current facility-administered medications for this visit.    Qudexy and thorazine for her migraines  ALLERGIES: Percocet [oxycodone-acetaminophen]       Family History  Problem Relation Age of Onset  . Alzheimer's disease Mother   . Hyperlipidemia Mother   . Thyroid disease Mother   . Diabetes Father   . Stroke Father     Social History        Social History  . Marital status: Married    Spouse name: N/A  . Number of children: 0  . Years of education: N/A       Occupational History  .  [employer]        Social  History Main Topics  . Smoking status: Former Smoker    Packs/day: 1.00    Years: 15.00    Quit date: 12/29/2011  . Smokeless tobacco: Never Used  . Alcohol use 4.2 - 7.2 oz/week    7 - 12 Glasses of wine per week  . Drug use: No  . Sexual activity: Yes    Partners: Male    Birth control/ protection: Post-menopausal       Other Topics Concern  . Not on file      Social History Narrative  . No narrative on file    Review of Systems  Constitutional: Negative.   HENT: Negative.   Eyes: Negative.   Respiratory: Negative.   Cardiovascular: Negative.   Gastrointestinal: Negative.   Genitourinary: Negative.   Musculoskeletal: Negative.   Skin: Negative.   Neurological: Negative.   Endo/Heme/Allergies: Negative.   Psychiatric/Behavioral: Negative.     PHYSICAL EXAMINATION:    BP 118/78 (BP Location: Right Arm, Patient Position: Sitting, Cuff Size: Normal)   Pulse 84   Resp 16   Wt 151 lb (68.5 kg)   LMP  05/12/2014 (Exact Date)   BMI 27.62 kg/m     General appearance: alert, cooperative and appears stated age Neck: no adenopathy, supple, symmetrical, trachea midline and thyroid normal to inspection and palpation Heart: regular rate and rhythm Lungs: CTAB Abdomen: soft, non-tender; bowel sounds normal; no masses,  no organomegaly Extremities: normal, atraumatic, no cyanosis Skin: normal color, texture and turgor, no rashes or lesions Lymph: normal cervical supraclavicular and inguinal nodes Neurologic: grossly normal   ASSESSMENT Postmenopausal bleeding, intracervical polyp noted on sonhysterogram    PLAN Plan: hysteroscopy, cervical polypectomy, dilation and curettage. Reviewed risks, including: bleeding, infection, uterine perforation, fluid overload, need for further sugery

## 2017-03-15 ENCOUNTER — Telehealth: Payer: Self-pay | Admitting: Obstetrics and Gynecology

## 2017-03-15 ENCOUNTER — Encounter (HOSPITAL_BASED_OUTPATIENT_CLINIC_OR_DEPARTMENT_OTHER): Payer: Self-pay | Admitting: *Deleted

## 2017-03-15 NOTE — Telephone Encounter (Signed)
[facility] [place] has some questions about the orders for the patient's procedure. [phone]

## 2017-03-15 NOTE — Telephone Encounter (Signed)
FMLA forms, in reference to surgery on 03/26/17, have been completed.  As requested by the patient, forms have been faxed to Orlie DakinAlba Ramirez, HR/Safety Coordinator at fax number [phone]. Fax transmittal received indicating the fax transmittal was successful.  Routing to Dr Oscar LaJertson

## 2017-03-15 NOTE — Telephone Encounter (Signed)
Return call to [place]. Clarification of type and screen for scheduled Hysteroscopy/D&C. Per Dr Oscar LaJertson, Type and screen is NOT needed. Denise notified.  Routing to provider for final review. Will close encounter.

## 2017-03-16 ENCOUNTER — Other Ambulatory Visit: Payer: Self-pay | Admitting: Obstetrics and Gynecology

## 2017-03-16 ENCOUNTER — Encounter (HOSPITAL_BASED_OUTPATIENT_CLINIC_OR_DEPARTMENT_OTHER): Payer: Self-pay | Admitting: *Deleted

## 2017-03-16 NOTE — Telephone Encounter (Signed)
Medication refill request: Estradiol Last AEX:  11/14/16 PG Next AEX: 11/16/17 PG Last MMG (if hormonal medication request): 12/07/16 [email or web address], Density B, [facility] Refill authorized: 01/25/17 #30 0R. Please advise. Thank you.   Medication refill request: Medroxyprogesterone Last AEX:  11/14/16 PG Next AEX: 11/16/17 PG Last MMG (if hormonal medication request): 12/07/16 [email or web address], Density B, [facility] Center Refill authorized: 01/25/17 #30 0R. Please advise. Thank you.

## 2017-03-16 NOTE — Progress Notes (Signed)
NPO AFTER MN.  ARRIVE AT 0915.  GETTING LAB WORK DONE Tuesday 03-20-2017 (CBC , CMET)WILL TAKE AM MEDS W/ SIPS OF WATER.

## 2017-03-20 ENCOUNTER — Encounter: Payer: Self-pay | Admitting: Obstetrics and Gynecology

## 2017-03-20 ENCOUNTER — Telehealth: Payer: Self-pay | Admitting: Obstetrics and Gynecology

## 2017-03-20 DIAGNOSIS — Z87891 Personal history of nicotine dependence: Secondary | ICD-10-CM | POA: Diagnosis not present

## 2017-03-20 DIAGNOSIS — Z885 Allergy status to narcotic agent status: Secondary | ICD-10-CM | POA: Diagnosis not present

## 2017-03-20 DIAGNOSIS — N950 Postmenopausal bleeding: Secondary | ICD-10-CM | POA: Diagnosis not present

## 2017-03-20 DIAGNOSIS — F419 Anxiety disorder, unspecified: Secondary | ICD-10-CM | POA: Diagnosis not present

## 2017-03-20 DIAGNOSIS — K219 Gastro-esophageal reflux disease without esophagitis: Secondary | ICD-10-CM | POA: Diagnosis not present

## 2017-03-20 DIAGNOSIS — N840 Polyp of corpus uteri: Secondary | ICD-10-CM | POA: Diagnosis present

## 2017-03-20 DIAGNOSIS — Z79899 Other long term (current) drug therapy: Secondary | ICD-10-CM | POA: Diagnosis not present

## 2017-03-20 LAB — CBC
HCT: 40.9 % (ref 36.0–46.0)
Hemoglobin: 13.8 g/dL (ref 12.0–15.0)
MCH: 30.8 pg (ref 26.0–34.0)
MCHC: 33.7 g/dL (ref 30.0–36.0)
MCV: 91.3 fL (ref 78.0–100.0)
Platelets: 221 10*3/uL (ref 150–400)
RBC: 4.48 MIL/uL (ref 3.87–5.11)
RDW: 13.4 % (ref 11.5–15.5)
WBC: 6.4 10*3/uL (ref 4.0–10.5)

## 2017-03-20 LAB — COMPREHENSIVE METABOLIC PANEL
ALT: 23 U/L (ref 14–54)
AST: 26 U/L (ref 15–41)
Albumin: 4.3 g/dL (ref 3.5–5.0)
Alkaline Phosphatase: 61 U/L (ref 38–126)
Anion gap: 8 (ref 5–15)
BUN: 18 mg/dL (ref 6–20)
CHLORIDE: 107 mmol/L (ref 101–111)
CO2: 20 mmol/L — AB (ref 22–32)
Calcium: 8.3 mg/dL — ABNORMAL LOW (ref 8.9–10.3)
Creatinine, Ser: 0.68 mg/dL (ref 0.44–1.00)
GFR calc Af Amer: >60 mL/min (ref >60)
GFR calc non Af Amer: >60 mL/min (ref >60)
Glucose, Bld: 94 mg/dL (ref 65–99)
POTASSIUM: 3.9 mmol/L (ref 3.5–5.1)
SODIUM: 135 mmol/L (ref 135–145)
Total Bilirubin: 0.3 mg/dL (ref 0.3–1.2)
Total Protein: 6.7 g/dL (ref 6.5–8.1)

## 2017-03-20 NOTE — Telephone Encounter (Signed)
Patient called from hospital upset about wait for lab work. Call disconnected before I could pick up.  Called patient back at 855 am. Patient was finally called back and had blood draw completed but states she is very upset regarding pre-op process. Apologized that her experience did not meet expectations and we would take this into consideration in improving process.  Routing to provider for final review. Patient agreeable to disposition. Will close encounter.

## 2017-03-20 NOTE — Telephone Encounter (Signed)
[facility] for her surgery on 03/26/17. Patient is upset because she has had to wait for 45 mins and no lab tech to draw her blood. Patient wants to know if she can have this lab work done at [facility].

## 2017-03-21 ENCOUNTER — Telehealth: Payer: Self-pay

## 2017-03-21 NOTE — Telephone Encounter (Signed)
Spoke with patient. Advised of message and results as seen below from Dr.Jerston. Patient is frustrated stating that she cannot return for blood work before surgery due to travel and time off work. Declines to schedule at this time. Advised will review with Dr.Jertson and return call.  Per review with Dr.Jertson anesthesia will need to be contacted to discuss low calcium level and work up before surgery.  Billie RuddySally Yeakley, RN is in process of contacting the hospital to discuss recommendations.

## 2017-03-21 NOTE — Telephone Encounter (Signed)
Call to patient. Per ROI, can leave message on cell voice mail. Voice mail has name confirmation. Left message with update that we are checking with anesthesia for clearance to proceed prior to labs. Also advised that if labs needed, can have drawn at draw station versus coming to office to save drive time. Will call her tomorrow when receive response from anesthesia.

## 2017-03-21 NOTE — Telephone Encounter (Signed)
[facility], Pre anesthesia testing department. Jasmine DecemberSharon will check with anesthesia regarding clearance for surgery and call us back tomorrow.

## 2017-03-21 NOTE — Telephone Encounter (Deleted)
-----   Message from Romualdo BolkJill Evelyn Jertson, MD sent at 03/21/2017  2:06 PM EDT ----- Please have her come in for the additional lab work. I've not heard back from her primary yet.

## 2017-03-21 NOTE — Telephone Encounter (Signed)
Left message to call Kaitlyn at [phone].  Notes recorded by Romualdo BolkJertson, Jill Evelyn, MD on 03/21/2017 at 2:06 PM EDT Please have her come in for the additional lab work. I've not heard back from her primary yet. ------  Notes recorded by Sprague, Caroleen HammanKaitlyn E, RN on 03/20/2017 at 11:15 AM EDT Checked with lab regarding adding on additional lab work. These labs cannot be added to lab work that was drawn as they need different tubes than what was collected. Would you like the patient to come to the office to have these done? ------  Notes recorded by Romualdo BolkJertson, Jill Evelyn, MD on 03/20/2017 at 10:30 AM EDT The patient's corrected calcium is still low at 8.39 mg/dl.  Can you please try and add on a PTH, vit D, Phosphate and magnesium level to her blood work from this morning. I will send a note to her primary as well in regards to surgery next week.

## 2017-03-22 NOTE — Telephone Encounter (Signed)
Update provided to Dr Oscar LaJertson. Per Dr Oscar LaJertson, can proceed with surgery and defer labwork to PCP management.   Call to patient. Per ROI, can leave message on voice mail which has name confirmation. Detailed message left with update regarding anesthesia clearance for surgery and Dr Oscar LaJertson recommendation for PCP follow-up of abnormal lab results. Left message to call back if any questions.  Routing to provider for final review. Encounter closed.

## 2017-03-22 NOTE — Progress Notes (Signed)
REVIEWED CHART AND LAB RESULTS W/ DR HOLLIS MDA.  DR HOLLIS STATED PT DOES NOT NEED LAB REPEATED, NO ISSUE W/ CURRENT CMET ABNORMAL RESULTS.  PROCEED W/ SURGERY.  CALLED AND LM VIA PHONE W/ SALLY RN AT DR Oscar LaJERTSON.

## 2017-03-22 NOTE — Telephone Encounter (Signed)
Spoke with patient. Advised of message as seen below from [place]. Patient states "There are no labs close to me. [place] [facility] and now I am on a time crunch to get this done." Advised patient will await recommendations from anesthesia before proceeding. Will work out lab scheduling at outside facility or with our office as needed. Patient verbalizes understanding.

## 2017-03-22 NOTE — Telephone Encounter (Signed)
[facility]. Per Dr Hart RochesterHollis in anesthesia, patient may proceed with surgery without additional lab work.

## 2017-03-26 ENCOUNTER — Encounter (HOSPITAL_BASED_OUTPATIENT_CLINIC_OR_DEPARTMENT_OTHER)
Admission: RE | Disposition: A | Discharge status: Home / Self Care | Payer: Self-pay | Source: Ambulatory Visit | Attending: Obstetrics and Gynecology

## 2017-03-26 ENCOUNTER — Ambulatory Visit (HOSPITAL_BASED_OUTPATIENT_CLINIC_OR_DEPARTMENT_OTHER)
Admission: RE | Admit: 2017-03-26 | Discharge: 2017-03-26 | Disposition: A | Discharge status: Home / Self Care | Payer: 59 | Source: Ambulatory Visit | Attending: Obstetrics and Gynecology | Admitting: Obstetrics and Gynecology

## 2017-03-26 ENCOUNTER — Ambulatory Visit (HOSPITAL_BASED_OUTPATIENT_CLINIC_OR_DEPARTMENT_OTHER): Payer: 59 | Admitting: Anesthesiology

## 2017-03-26 ENCOUNTER — Encounter (HOSPITAL_BASED_OUTPATIENT_CLINIC_OR_DEPARTMENT_OTHER): Payer: Self-pay | Admitting: *Deleted

## 2017-03-26 DIAGNOSIS — K219 Gastro-esophageal reflux disease without esophagitis: Secondary | ICD-10-CM | POA: Insufficient documentation

## 2017-03-26 DIAGNOSIS — N840 Polyp of corpus uteri: Secondary | ICD-10-CM | POA: Insufficient documentation

## 2017-03-26 DIAGNOSIS — F419 Anxiety disorder, unspecified: Secondary | ICD-10-CM | POA: Insufficient documentation

## 2017-03-26 DIAGNOSIS — Z87891 Personal history of nicotine dependence: Secondary | ICD-10-CM | POA: Insufficient documentation

## 2017-03-26 DIAGNOSIS — N950 Postmenopausal bleeding: Secondary | ICD-10-CM | POA: Insufficient documentation

## 2017-03-26 DIAGNOSIS — Z885 Allergy status to narcotic agent status: Secondary | ICD-10-CM | POA: Insufficient documentation

## 2017-03-26 DIAGNOSIS — Z79899 Other long term (current) drug therapy: Secondary | ICD-10-CM | POA: Insufficient documentation

## 2017-03-26 DIAGNOSIS — N841 Polyp of cervix uteri: Secondary | ICD-10-CM | POA: Diagnosis not present

## 2017-03-26 HISTORY — DX: Postmenopausal bleeding: N95.0

## 2017-03-26 HISTORY — DX: Personal history of traumatic brain injury: Z87.820

## 2017-03-26 HISTORY — DX: Personal history of other diseases of the circulatory system: Z86.79

## 2017-03-26 HISTORY — DX: Presence of spectacles and contact lenses: Z97.3

## 2017-03-26 HISTORY — DX: Personal history of adenomatous and serrated colon polyps: Z86.0101

## 2017-03-26 HISTORY — DX: Gastro-esophageal reflux disease without esophagitis: K21.9

## 2017-03-26 HISTORY — DX: Personal history of colonic polyps: Z86.010

## 2017-03-26 HISTORY — PX: DILATATION & CURETTAGE/HYSTEROSCOPY WITH MYOSURE: SHX6511

## 2017-03-26 SURGERY — DILATATION & CURETTAGE/HYSTEROSCOPY WITH MYOSURE
Anesthesia: General | Site: Vagina

## 2017-03-26 MED ORDER — LIDOCAINE 2% (20 MG/ML) 5 ML SYRINGE
INTRAMUSCULAR | Status: DC | PRN
  Administered 2017-03-26: 100 mg via INTRAVENOUS

## 2017-03-26 MED ORDER — MIDAZOLAM HCL 5 MG/5ML IJ SOLN
INTRAMUSCULAR | Status: DC | PRN
  Administered 2017-03-26: 2 mg via INTRAVENOUS

## 2017-03-26 MED ORDER — PROPOFOL 10 MG/ML IV BOLUS
INTRAVENOUS | Status: DC | PRN
  Administered 2017-03-26: 180 mg via INTRAVENOUS

## 2017-03-26 MED ORDER — SCOPOLAMINE 1 MG/3DAYS TD PT72
MEDICATED_PATCH | TRANSDERMAL | Status: AC
  Filled 2017-03-26: qty 1

## 2017-03-26 MED ORDER — KETOROLAC TROMETHAMINE 30 MG/ML IJ SOLN
INTRAMUSCULAR | Status: DC | PRN
  Administered 2017-03-26: 30 mg via INTRAVENOUS

## 2017-03-26 MED ORDER — KETOROLAC TROMETHAMINE 30 MG/ML IJ SOLN
INTRAMUSCULAR | Status: AC
  Filled 2017-03-26: qty 1

## 2017-03-26 MED ORDER — SODIUM CHLORIDE 0.9 % IR SOLN
Status: DC | PRN
  Administered 2017-03-26: 3000 mL

## 2017-03-26 MED ORDER — HYDROMORPHONE HCL 1 MG/ML IJ SOLN
0.2500 mg | INTRAMUSCULAR | Status: DC | PRN
  Filled 2017-03-26: qty 0.5

## 2017-03-26 MED ORDER — FENTANYL CITRATE (PF) 100 MCG/2ML IJ SOLN
INTRAMUSCULAR | Status: DC | PRN
  Administered 2017-03-26 (×2): 50 ug via INTRAVENOUS

## 2017-03-26 MED ORDER — MIDAZOLAM HCL 2 MG/2ML IJ SOLN
INTRAMUSCULAR | Status: AC
  Filled 2017-03-26: qty 2

## 2017-03-26 MED ORDER — DEXAMETHASONE SODIUM PHOSPHATE 10 MG/ML IJ SOLN
INTRAMUSCULAR | Status: AC
  Filled 2017-03-26: qty 1

## 2017-03-26 MED ORDER — DEXAMETHASONE SODIUM PHOSPHATE 10 MG/ML IJ SOLN
INTRAMUSCULAR | Status: DC | PRN
  Administered 2017-03-26: 10 mg via INTRAVENOUS

## 2017-03-26 MED ORDER — FENTANYL CITRATE (PF) 100 MCG/2ML IJ SOLN
INTRAMUSCULAR | Status: AC
  Filled 2017-03-26: qty 2

## 2017-03-26 MED ORDER — ONDANSETRON HCL 4 MG/2ML IJ SOLN
INTRAMUSCULAR | Status: DC | PRN
  Administered 2017-03-26: 4 mg via INTRAVENOUS

## 2017-03-26 MED ORDER — ONDANSETRON HCL 4 MG/2ML IJ SOLN
INTRAMUSCULAR | Status: AC
Start: 2017-03-26 — End: 2017-03-26
  Filled 2017-03-26: qty 2

## 2017-03-26 MED ORDER — SCOPOLAMINE 1 MG/3DAYS TD PT72
1.0000 | MEDICATED_PATCH | TRANSDERMAL | Status: DC
  Administered 2017-03-26: 1.5 mg via TRANSDERMAL
  Filled 2017-03-26: qty 1

## 2017-03-26 MED ORDER — LACTATED RINGERS IV SOLN
INTRAVENOUS | Status: DC
  Filled 2017-03-26: qty 1000

## 2017-03-26 MED ORDER — PROMETHAZINE HCL 25 MG/ML IJ SOLN
6.2500 mg | INTRAMUSCULAR | Status: DC | PRN
  Filled 2017-03-26: qty 1

## 2017-03-26 MED ORDER — PROPOFOL 10 MG/ML IV BOLUS
INTRAVENOUS | Status: AC
  Filled 2017-03-26: qty 20

## 2017-03-26 MED ORDER — LACTATED RINGERS IV SOLN
INTRAVENOUS | Status: DC
  Administered 2017-03-26: 10:00:00 via INTRAVENOUS
  Filled 2017-03-26: qty 1000

## 2017-03-26 MED ORDER — LIDOCAINE 2% (20 MG/ML) 5 ML SYRINGE
INTRAMUSCULAR | Status: AC
  Filled 2017-03-26: qty 5

## 2017-03-26 SURGICAL SUPPLY — 30 items
CANISTER SUCT 3000ML PPV (MISCELLANEOUS) ×4 IMPLANT
CATH ROBINSON RED A/P 16FR (CATHETERS) IMPLANT
DEVICE MYOSURE LITE (MISCELLANEOUS) ×1 IMPLANT
DEVICE MYOSURE REACH (MISCELLANEOUS) IMPLANT
DILATOR CANAL MILEX (MISCELLANEOUS) IMPLANT
ELECT REM PT RETURN 9FT ADLT (ELECTROSURGICAL)
ELECTRODE REM PT RTRN 9FT ADLT (ELECTROSURGICAL) IMPLANT
FILTER ARTHROSCOPY CONVERTOR (FILTER) ×2 IMPLANT
GLOVE BIO SURGEON STRL SZ 6.5 (GLOVE) ×2 IMPLANT
GLOVE BIOGEL PI IND STRL 7.0 (GLOVE) IMPLANT
GLOVE BIOGEL PI IND STRL 7.5 (GLOVE) IMPLANT
GLOVE BIOGEL PI INDICATOR 7.0 (GLOVE) ×1
GLOVE BIOGEL PI INDICATOR 7.5 (GLOVE) ×1
GLOVE ECLIPSE 7.0 STRL STRAW (GLOVE) ×1 IMPLANT
GOWN STRL REUS W/ TWL LRG LVL3 (GOWN DISPOSABLE) ×1 IMPLANT
GOWN STRL REUS W/TWL LRG LVL3 (GOWN DISPOSABLE) ×1 IMPLANT
GOWN STRL REUS W/TWL XL LVL3 (GOWN DISPOSABLE) ×1 IMPLANT
IV NS IRRIG 3000ML ARTHROMATIC (IV SOLUTION) ×4 IMPLANT
KIT RM TURNOVER CYSTO AR (KITS) ×2 IMPLANT
MYOSURE XL FIBROID REM (MISCELLANEOUS)
PACK VAGINAL MINOR WOMEN LF (CUSTOM PROCEDURE TRAY) ×2 IMPLANT
PAD OB MATERNITY 4.3X12.25 (PERSONAL CARE ITEMS) ×2 IMPLANT
PAD PREP 24X48 CUFFED NSTRL (MISCELLANEOUS) ×2 IMPLANT
SEAL ROD LENS SCOPE MYOSURE (ABLATOR) ×2 IMPLANT
SYR 20CC LL (SYRINGE) IMPLANT
SYSTEM TISS REMOVAL MYSR XL RM (MISCELLANEOUS) IMPLANT
TOWEL OR 17X24 6PK STRL BLUE (TOWEL DISPOSABLE) ×4 IMPLANT
TUBING AQUILEX INFLOW (TUBING) ×2 IMPLANT
TUBING AQUILEX OUTFLOW (TUBING) ×2 IMPLANT
WATER STERILE IRR 500ML POUR (IV SOLUTION) IMPLANT

## 2017-03-26 NOTE — Discharge Instructions (Signed)
  Post Anesthesia Home Care Instructions  Activity: Get plenty of rest for the remainder of the day. A responsible individual must stay with you for 24 hours following the procedure.  For the next 24 hours, DO NOT: -Drive a car -Operate machinery -Drink alcoholic beverages -Take any medication unless instructed by your physician -Make any legal decisions or sign important papers.  Meals: Start with liquid foods such as gelatin or soup. Progress to regular foods as tolerated. Avoid greasy, spicy, heavy foods. If nausea and/or vomiting occur, drink only clear liquids until the nausea and/or vomiting subsides. Call your physician if vomiting continues.  Special Instructions/Symptoms: Your throat may feel dry or sore from the anesthesia or the breathing tube placed in your throat during surgery. If this causes discomfort, gargle with warm salt water. The discomfort should disappear within 24 hours.  If you had a scopolamine patch placed behind your ear for the management of post- operative nausea and/or vomiting:  1. The medication in the patch is effective for 72 hours, after which it should be removed.  Wrap patch in a tissue and discard in the trash. Wash hands thoroughly with soap and water. 2. You may remove the patch earlier than 72 hours if you experience unpleasant side effects which may include dry mouth, dizziness or visual disturbances. 3. Avoid touching the patch. Wash your hands with soap and water after contact with the patch.      D & C Home care Instructions:   Personal hygiene:  Used sanitary napkins for vaginal drainage not tampons. Shower or tub bathe the day after your procedure. No douching until bleeding stops. Always wipe from front to back after  Elimination.  Activity: Do not drive or operate any equipment today. The effects of the anesthesia are still present and drowsiness may result. Rest today, not necessarily flat bed rest, just take it easy. You may resume your  normal activity in one to 2 days.  Sexual activity: No intercourse for one week or as indicated by your physician  Diet: Eat a light diet as desired this evening. You may resume a regular diet tomorrow.  Return to work: One to 2 days.  General Expectations of your surgery: Vaginal bleeding should be no heavier than a normal period. Spotting may continue up to 10 days. Mild cramps may continue for a couple of days. You may have a regular period in 2-6 weeks.  Unexpected observations call your doctor if these occur: persistent or heavy bleeding. Severe abdominal cramping or pain. Elevation of temperature greater than 100F.  Call for an appointment in one week.   

## 2017-03-26 NOTE — Op Note (Signed)
Preoperative Diagnosis: Postmenopausal bleeding, endocervical polyp  Postoperative Diagnosis: Postmenopausal bleeding, endocervical vs endometrial polyp  Procedure: Hysteroscopy, polypectomy, dilation and curettage  Surgeon: Dr Gertie ExonJill Estevan Kersh  Assistants: None  Anesthesia: General via LMA  EBL: 5 cc  Fluids: 700 cc LR  Fluid deficit: 30 cc  Urine output: not recored  Indications for surgery: The patient is a 51 yo female, who presented with postmenopausal bleeding on hormone replacement therapy. Work up included a sonohysterogram that showed a thin endometrial stripe and no endometrial abnormalities, an endocervical polyp was noted.  The risks of the surgery were reviewed with the patient and the consent form was signed prior to her surgery.  Findings: EUA: small anteverted, mobile uterus, no adnexal masses. Hysteroscopy: thin endometrium, normal tubal ostia bilaterally, polyp in endometrial cavity, suspect pushed up from the cervix.   Specimens: Endometrial polyp (could be endocervical), endocervical curettage, endometrial curettage   Procedure: The patient was taken to the operating room with an IV in place. She was placed in the dorsal lithotomy position and anesthesia was administered. She was prepped and draped in the usual sterile fashion for a vaginal procedure. She voided on the way to the OR. A weighted speculum was placed in the vagina and a single tooth tenaculum was placed on the anterior lip of the cervix. The cervix was dilated to a # 7 hagar dilator. The uterus was sounded to 6 cm. The myosure hysteroscope was inserted into the uterine cavity. With continuous infusion of normal saline, the uterine cavity was visualized with the above findings. The myosure light was used to resect the polyp. The myosure was then removed. An endocervical curettage was performed. The endometrial cavity was then curetted with the small sharp curette. The cavity had the characteristically gritty  texture at the end of the procedure. The curette and the single tooth tenaculum were removed. Oozing from the tenaculum site was stopped with pressure. The speculum was removed. The patients perineum was cleansed of betadine and she was taken out of the dorsal lithotomy position.  Upon awakening the LMA was removed and the patient was transferred to the recovery room in stable and awake condition.  The sponge and instrument count were correct. There were no complications.

## 2017-03-26 NOTE — Transfer of Care (Signed)
Immediate Anesthesia Transfer of Care Note  Patient: Amy ServeBarbara Barr  Procedure(s) Performed: Procedure(s): DILATATION & CURETTAGE/HYSTEROSCOPY WITH MYOSURE (N/A)  Patient Location: PACU  Anesthesia Type:General  Level of Consciousness: awake, alert  and oriented  Airway & Oxygen Therapy: Patient Spontanous Breathing and Patient connected to nasal cannula oxygen  Post-op Assessment: Report given to RN  Post vital signs: Reviewed and stable  Last Vitals:  Vitals:   03/26/17 0918 03/26/17 1145  BP: 126/79 115/62  Pulse: 72 (!) 58  Resp: 16   Temp: 36.5 C 36.4 C    Last Pain:  Vitals:   03/26/17 0918  TempSrc: Oral      Patients Stated Pain Goal: 7 (03/26/17 0945)  Complications: No apparent anesthesia complications

## 2017-03-26 NOTE — Anesthesia Preprocedure Evaluation (Signed)
Anesthesia Evaluation  Patient identified by MRN, date of birth, ID band Patient awake    Reviewed: Allergy & Precautions, NPO status , Patient's Chart, lab work & pertinent test results  History of Anesthesia Complications Negative for: history of anesthetic complications  Airway Mallampati: II  TM Distance: >3 FB Neck ROM: Full    Dental no notable dental hx. (+) Dental Advisory Given   Pulmonary former smoker,    Pulmonary exam normal        Cardiovascular negative cardio ROS Normal cardiovascular exam     Neuro/Psych  Headaches, Anxiety    GI/Hepatic Neg liver ROS, GERD  ,  Endo/Other  negative endocrine ROS  Renal/GU negative Renal ROS  negative genitourinary   Musculoskeletal negative musculoskeletal ROS (+)   Abdominal   Peds negative pediatric ROS (+)  Hematology negative hematology ROS (+)   Anesthesia Other Findings   Reproductive/Obstetrics negative OB ROS                             Anesthesia Physical Anesthesia Plan  ASA: II  Anesthesia Plan: General   Post-op Pain Management:    Induction: Intravenous  Airway Management Planned: LMA  Additional Equipment:   Intra-op Plan:   Post-operative Plan: Extubation in OR  Informed Consent: I have reviewed the patients History and Physical, chart, labs and discussed the procedure including the risks, benefits and alternatives for the proposed anesthesia with the patient or authorized representative who has indicated his/her understanding and acceptance.   Dental advisory given  Plan Discussed with: CRNA, Anesthesiologist and Surgeon  Anesthesia Plan Comments:         Anesthesia Quick Evaluation

## 2017-03-26 NOTE — Interval H&P Note (Signed)
History and Physical Interval Note:  03/26/2017 9:35 AM  Amy ServeBarbara Barr  has presented today for surgery, with the diagnosis of PMB, possible endometrial polyp  The various methods of treatment have been discussed with the patient and family. After consideration of risks, benefits and other options for treatment, the patient has consented to  Procedure(s): DILATATION & CURETTAGE/HYSTEROSCOPY WITH MYOSURE (N/A) as a surgical intervention .  The patient's history has been reviewed, patient examined, no change in status, stable for surgery.  I have reviewed the patient's chart and labs.  Questions were answered to the patient's satisfaction.   The patient has a mildly low calcium level and will f/u with her primary MD.    Romualdo BolkJill Evelyn Reegan Mctighe

## 2017-03-26 NOTE — Anesthesia Procedure Notes (Signed)
Procedure Name: LMA Insertion Date/Time: 03/26/2017 11:16 AM Performed by: Maris BergerENENNY, Min Tunnell T Pre-anesthesia Checklist: Patient identified, Emergency Drugs available, Suction available and Patient being monitored Patient Re-evaluated:Patient Re-evaluated prior to inductionOxygen Delivery Method: Circle system utilized Preoxygenation: Pre-oxygenation with 100% oxygen Intubation Type: IV induction Ventilation: Mask ventilation without difficulty LMA: LMA inserted LMA Size: 4.0 Number of attempts: 1 Airway Equipment and Method: Bite block Placement Confirmation: positive ETCO2 Tube secured with: Tape Dental Injury: Teeth and Oropharynx as per pre-operative assessment

## 2017-03-26 NOTE — Anesthesia Postprocedure Evaluation (Signed)
Anesthesia Post Note  Patient: Amy Barr  Procedure(s) Performed: Procedure(s) (LRB): DILATATION & CURETTAGE/HYSTEROSCOPY WITH MYOSURE (N/A)     Patient location during evaluation: PACU Anesthesia Type: General Level of consciousness: sedated Pain management: pain level controlled Vital Signs Assessment: post-procedure vital signs reviewed and stable Respiratory status: spontaneous breathing and respiratory function stable Cardiovascular status: stable Anesthetic complications: no    Last Vitals:  Vitals:   03/26/17 1200 03/26/17 1215  BP: 109/63 115/84  Pulse: (!) 52 (!) 55  Resp: 11 17  Temp:      Last Pain:  Vitals:   03/26/17 0918  TempSrc: Oral                 Shellene Sweigert DANIEL

## 2017-03-27 ENCOUNTER — Encounter (HOSPITAL_BASED_OUTPATIENT_CLINIC_OR_DEPARTMENT_OTHER): Payer: Self-pay | Admitting: Obstetrics and Gynecology

## 2017-04-11 ENCOUNTER — Encounter: Payer: Self-pay | Admitting: Obstetrics and Gynecology

## 2017-04-11 ENCOUNTER — Ambulatory Visit (INDEPENDENT_AMBULATORY_CARE_PROVIDER_SITE_OTHER): Payer: 59 | Admitting: Obstetrics and Gynecology

## 2017-04-11 VITALS — BP 120/88 | HR 64 | Resp 14 | Wt 149.0 lb

## 2017-04-11 DIAGNOSIS — Z9889 Other specified postprocedural states: Secondary | ICD-10-CM | POA: Diagnosis not present

## 2017-04-11 NOTE — Progress Notes (Signed)
GYNECOLOGY  VISIT   HPI: 51 y.o.   Married  Caucasian  female   G2P0020 with Patient's last menstrual period was 05/12/2014 (exact date).   here 16 days s/p D&C hysteroscopy with myosure -eh. Pathology with benign polyp and atrophic endometrium. She is feeling fine, no bleeding, no pain.   GYNECOLOGIC HISTORY: Patient's last menstrual period was 05/12/2014 (exact date). Contraception:postmenopause Menopausal hormone therapy: Estrace and Provera         OB History    Gravida Para Term Preterm AB Living   2 0 0 0 2 0   SAB TAB Ectopic Multiple Live Births   1 1 0 0 0         There are no active problems to display for this patient.   Past Medical History:  Diagnosis Date  . Anxiety   . GERD (gastroesophageal reflux disease)   . History of adenomatous polyp of colon    12-17-2015  TUBULAR ADENOMA AND HYPERPLASTIC  . History of cardiac murmur    per pt told was mild at 1989 surgery  . History of multiple concussions    per pt last one 1992 , approx.,  residual migraines which is along with hereditary migraines  . Migraine   . PMB (postmenopausal bleeding)   . Wears glasses     Past Surgical History:  Procedure Laterality Date  . CARPAL TUNNEL RELEASE Right 01/29/2009  . COLONOSCOPY  12/17/2015  . DILATATION & CURETTAGE/HYSTEROSCOPY WITH MYOSURE N/A 03/26/2017   Procedure: DILATATION & CURETTAGE/HYSTEROSCOPY WITH MYOSURE;  Surgeon: Romualdo Bolk, MD;  Location: [facility];  Service: Gynecology;  Laterality: N/A;  . MANDIBLE SURGERY  1989   complete mandible reconstruction for previous injury as younger child--  per pt retained hardware  . NASAL SINUS SURGERY  2002  . ROTATOR CUFF REPAIR Right 1992    Current Outpatient Prescriptions  Medication Sig Dispense Refill  . b complex vitamins tablet Take 1 tablet by mouth daily.    . chlorproMAZINE (THORAZINE) 25 MG tablet Take 1-2 tablets by mouth as needed.   1  . estradiol (ESTRACE) 0.5 MG tablet TAKE  ONE TABLET BY MOUTH EVERY DAY 90 tablet 3  . FLUoxetine (PROZAC) 10 MG capsule Take 1 capsule (10 mg total) by mouth daily. (Patient taking differently: Take 10 mg by mouth at bedtime. ) 90 capsule 4  . medroxyPROGESTERone (PROVERA) 5 MG tablet TAKE ONE TABLET BY MOUTH EVERY DAY 90 tablet 2  . Melatonin 5 MG CAPS Take by mouth at bedtime.     . Misc Natural Products (OSTEO BI-FLEX ADV DOUBLE ST PO) Take by mouth.    . Multiple Vitamin (MULTIVITAMIN) capsule Take 1 capsule by mouth daily.    Marland Kitchen omeprazole (PRILOSEC) 40 MG capsule Take 40 mg by mouth every morning.     . Probiotic Product (PHILLIPS COLON HEALTH PO) Take by mouth.    . QUDEXY XR 200 MG CS24 Take 2 capsules by mouth every morning.   5  . zolmitriptan (ZOMIG) 5 MG nasal solution Place into the nose as needed for migraine.     No current facility-administered medications for this visit.      ALLERGIES: Oxycodone  Family History  Problem Relation Age of Onset  . Alzheimer's disease Mother   . Hyperlipidemia Mother   . Thyroid disease Mother   . Diabetes Father   . Stroke Father     Social History   Social History  . Marital status: Married  Spouse name: N/A  . Number of children: 0  . Years of education: N/A   Occupational History  .  [employer]   Social History Main Topics  . Smoking status: Former Smoker    Packs/day: 1.00    Years: 15.00    Types: Cigarettes    Quit date: 12/29/2011  . Smokeless tobacco: Never Used  . Alcohol use 4.2 - 7.2 oz/week    7 - 12 Glasses of wine per week  . Drug use: No  . Sexual activity: Yes    Partners: Male    Birth control/ protection: Post-menopausal   Other Topics Concern  . Not on file   Social History Narrative  . No narrative on file    Review of Systems  Constitutional: Negative.   HENT: Negative.   Eyes: Negative.   Respiratory: Negative.   Cardiovascular: Negative.   Gastrointestinal: Negative.   Genitourinary: Negative.   Musculoskeletal: Negative.    Skin: Negative.   Neurological: Negative.   Endo/Heme/Allergies: Negative.   Psychiatric/Behavioral: Negative.     PHYSICAL EXAMINATION:    BP 120/88 (BP Location: Right Arm, Patient Position: Standing, Cuff Size: Normal)   Pulse 64   Resp 14   Wt 149 lb (67.6 kg)   LMP 05/12/2014 (Exact Date)   BMI 25.98 kg/m     General appearance: alert, cooperative and appears stated age  Reviewed hysteroscopy images and pathology  ASSESSMENT S/P hysteroscopy, polypectomy, D&C, doing well. Pathology was benign. Other than the polyp her lining was atrophic On HRT, she will continue this for now    PLAN F/U next year for her annual Call with any bleeding or concerns    An After Visit Summary was printed and given to the patient.  CC: Shirlyn GoltzPatty Grubb, NP

## 2017-05-29 ENCOUNTER — Telehealth: Payer: Self-pay | Admitting: Obstetrics & Gynecology

## 2017-05-29 NOTE — Telephone Encounter (Signed)
Left message regarding upcoming appointment has been canceled and needs to be rescheduled. °

## 2017-08-17 ENCOUNTER — Encounter: Payer: Self-pay | Admitting: Obstetrics and Gynecology

## 2017-11-15 ENCOUNTER — Ambulatory Visit: Payer: 59 | Admitting: Obstetrics and Gynecology

## 2017-11-16 ENCOUNTER — Ambulatory Visit: Payer: 59 | Admitting: Nurse Practitioner

## 2017-12-03 ENCOUNTER — Other Ambulatory Visit: Payer: Self-pay

## 2017-12-03 MED ORDER — MEDROXYPROGESTERONE ACETATE 5 MG PO TABS: 5.0000 mg | ORAL_TABLET | Freq: Every day | ORAL | 0 refills | Status: DC

## 2017-12-03 NOTE — Telephone Encounter (Signed)
Received faxed refill request from [facility].  Medication refill request: G.Provera 5mg  #90 Last AEX:  11-14-16 Next AEX: 01-10-18 with Dr.Jertson Last MMG (if hormonal medication request): 12-07-16 RUE:[id number] Refill authorized: requesting refill on Provera, please advise

## 2017-12-03 NOTE — Addendum Note (Signed)
Addended by: Alphonsa OverallIXON, Annastacia Duba L on: 12/03/2017 02:14 PM   Modules accepted: Orders

## 2017-12-17 ENCOUNTER — Other Ambulatory Visit: Payer: Self-pay | Admitting: *Deleted

## 2017-12-17 MED ORDER — FLUOXETINE HCL 10 MG PO CAPS: 10.0000 mg | ORAL_CAPSULE | Freq: Every day | ORAL | 0 refills | Status: DC

## 2017-12-17 NOTE — Telephone Encounter (Signed)
Medication refill request: Prozac  Last AEX:  11/14/16 PG  Next AEX: 01/10/18  Last MMG (if hormonal medication request):  Refill authorized: please advise

## 2018-01-10 ENCOUNTER — Encounter: Payer: Self-pay | Admitting: Obstetrics and Gynecology

## 2018-01-10 ENCOUNTER — Ambulatory Visit: Payer: PRIVATE HEALTH INSURANCE | Admitting: Obstetrics and Gynecology

## 2018-01-10 ENCOUNTER — Other Ambulatory Visit: Payer: Self-pay

## 2018-01-10 VITALS — BP 122/76 | HR 84 | Resp 14 | Ht 62.0 in | Wt 148.0 lb

## 2018-01-10 DIAGNOSIS — Z01419 Encounter for gynecological examination (general) (routine) without abnormal findings: Secondary | ICD-10-CM

## 2018-01-10 DIAGNOSIS — Z0000 Encounter for general adult medical examination without abnormal findings: Secondary | ICD-10-CM | POA: Diagnosis not present

## 2018-01-10 DIAGNOSIS — Z833 Family history of diabetes mellitus: Secondary | ICD-10-CM | POA: Diagnosis not present

## 2018-01-10 DIAGNOSIS — Z79890 Hormone replacement therapy (postmenopausal): Secondary | ICD-10-CM

## 2018-01-10 MED ORDER — ESTRADIOL 0.5 MG PO TABS: 0.5000 mg | ORAL_TABLET | Freq: Every day | ORAL | 3 refills | Status: DC

## 2018-01-10 MED ORDER — MEDROXYPROGESTERONE ACETATE 5 MG PO TABS: 5.0000 mg | ORAL_TABLET | Freq: Every day | ORAL | 3 refills | Status: DC

## 2018-01-10 MED ORDER — FLUOXETINE HCL 10 MG PO CAPS: 10.0000 mg | ORAL_CAPSULE | Freq: Every day | ORAL | 3 refills | Status: DC

## 2018-01-10 NOTE — Patient Instructions (Signed)

## 2018-01-10 NOTE — Progress Notes (Signed)
52 y.o. [id number] Widow CaucasianF here for annual exam.  PMP, no bleeding.  Her husband died in 8/19 of a stroke at 5963. She is really sad today, overall she is doing better. Doesn't think she is depressed.     Patient's last menstrual period was 05/12/2014 (exact date).          Sexually active: No.  The current method of family planning is post menopausal status.    Exercising: Yes.    couple times a day  Smoker:  no  Health Maintenance: Pap:  09-20-15 WNL NEG HR HPV 08-27-12 WNL NEG HR HPV  History of abnormal Pap:  Yes had cryosurgrey  MMG:  12-07-16 WNL  Colonoscopy:  12-17-15 polyps repeat in 5 yrs BMD:   Never TDaP:  07-25-11 Gardasil: N/A   reports that she quit smoking about 6 years ago. Her smoking use included cigarettes. She has a 15.00 pack-year smoking history. She has never used smokeless tobacco. She reports that she drinks about 4.2 - 7.2 oz of alcohol per week. She reports that she does not use drugs.  Past Medical History:  Diagnosis Date  . Anxiety   . GERD (gastroesophageal reflux disease)   . History of adenomatous polyp of colon    12-17-2015  TUBULAR ADENOMA AND HYPERPLASTIC  . History of cardiac murmur    per pt told was mild at 1989 surgery  . History of multiple concussions    per pt last one 1992 , approx.,  residual migraines which is along with hereditary migraines  . Migraine   . PMB (postmenopausal bleeding)   . Wears glasses     Past Surgical History:  Procedure Laterality Date  . CARPAL TUNNEL RELEASE Right 01/29/2009  . COLONOSCOPY  12/17/2015  . DILATATION & CURETTAGE/HYSTEROSCOPY WITH MYOSURE N/A 03/26/2017   Procedure: DILATATION & CURETTAGE/HYSTEROSCOPY WITH MYOSURE;  Surgeon: Romualdo BolkJertson, Francesca Strome Evelyn, MD;  [facility];  Service: Gynecology;  Laterality: N/A;  . MANDIBLE SURGERY  1989   complete mandible reconstruction for previous injury as younger child--  per pt retained hardware  . NASAL SINUS SURGERY  2002  .  ROTATOR CUFF REPAIR Right 1992    Current Outpatient Medications  Medication Sig Dispense Refill  . b complex vitamins tablet Take 1 tablet by mouth daily.    . chlorproMAZINE (THORAZINE) 25 MG tablet Take 1-2 tablets by mouth as needed.   1  . estradiol (ESTRACE) 0.5 MG tablet TAKE ONE TABLET BY MOUTH EVERY DAY 90 tablet 3  . FLUoxetine (PROZAC) 10 MG capsule Take 1 capsule (10 mg total) by mouth daily. 30 capsule 0  . medroxyPROGESTERone (PROVERA) 5 MG tablet Take 1 tablet (5 mg total) by mouth daily. 90 tablet 0  . Melatonin 5 MG CAPS Take by mouth at bedtime.     . Misc Natural Products (OSTEO BI-FLEX ADV DOUBLE ST PO) Take by mouth.    . Multiple Vitamin (MULTIVITAMIN) capsule Take 1 capsule by mouth daily.    Marland Kitchen. omeprazole (PRILOSEC) 40 MG capsule Take 40 mg by mouth every morning.     . Probiotic Product (PHILLIPS COLON HEALTH PO) Take by mouth.    . QUDEXY XR 200 MG CS24 Take 2 capsules by mouth every morning.   5  . zolmitriptan (ZOMIG) 5 MG nasal solution Place into the nose as needed for migraine.     No current facility-administered medications for this visit.   Only takes the thorazine 1 x a month for  bad migraines.   Family History  Problem Relation Age of Onset  . Alzheimer's disease Mother   . Hyperlipidemia Mother   . Thyroid disease Mother   . Diabetes Father   . Stroke Father     Review of Systems  Constitutional: Negative.   HENT: Negative.   Eyes: Negative.   Respiratory: Negative.   Cardiovascular: Negative.   Gastrointestinal: Negative.   Endocrine: Negative.   Genitourinary: Negative.   Musculoskeletal: Negative.   Skin: Negative.   Allergic/Immunologic: Negative.   Neurological: Negative.   Psychiatric/Behavioral: Negative.     Exam:   BP 122/76 (BP Location: Right Arm, Patient Position: Sitting, Cuff Size: Normal)   Pulse 84   Resp 14   Ht 5\' 2"  (1.575 m)   Wt 148 lb (67.1 kg)   LMP 05/12/2014 (Exact Date)   BMI 27.07 kg/m   Weight  change: @WEIGHTCHANGE @ Height:   Height: 5\' 2"  (157.5 cm)  Ht Readings from Last 3 Encounters:  01/10/18 5\' 2"  (1.575 m)  03/26/17 5' 3.5" (1.613 m)  11/14/16 5\' 2"  (1.575 m)    General appearance: alert, cooperative and appears stated age Head: Normocephalic, without obvious abnormality, atraumatic Neck: no adenopathy, supple, symmetrical, trachea midline and thyroid normal to inspection and palpation Lungs: clear to auscultation bilaterally Cardiovascular: regular rate and rhythm Breasts: normal appearance, no masses or tenderness Abdomen: soft, non-tender; non distended,  no masses,  no organomegaly Extremities: extremities normal, atraumatic, no cyanosis or edema Skin: Skin color, texture, turgor normal. No rashes or lesions Lymph nodes: Cervical, supraclavicular, and axillary nodes normal. No abnormal inguinal nodes palpated Neurologic: Grossly normal   Pelvic: External genitalia:  no lesions              Urethra:  normal appearing urethra with no masses, tenderness or lesions              Bartholins and Skenes: normal                 Vagina: normal appearing vagina with normal color and discharge, no lesions              Cervix: no lesions               Bimanual Exam:  Uterus:  normal size, contour, position, consistency, mobility, non-tender              Adnexa: no mass, fullness, tenderness               Rectovaginal: Confirms               Anus:  normal sphincter tone, no lesions  Chaperone was present for exam.  A:  Well Woman with normal exam  HRT, wants to continue, aware of risks  Family history of DM  Depression controlled on prozac  P:   Pap next year  On calcium with vit d  Doing breast self exams  Mammogram due  Colonoscopy UTD  Screening labs  Continue HRT and prozac

## 2018-01-11 LAB — LIPID PANEL
CHOL/HDL RATIO: 5.1 ratio — AB (ref 0.0–4.4)
Cholesterol, Total: 203 mg/dL — ABNORMAL HIGH (ref 100–199)
HDL: 40 mg/dL (ref >39)
LDL CALC: 112 mg/dL — AB (ref 0–99)
Triglycerides: 254 mg/dL — ABNORMAL HIGH (ref 0–149)
VLDL CHOLESTEROL CAL: 51 mg/dL — AB (ref 5–40)

## 2018-01-11 LAB — VITAMIN D 25 HYDROXY (VIT D DEFICIENCY, FRACTURES): VIT D 25 HYDROXY: 34.8 ng/mL (ref 30.0–100.0)

## 2018-01-11 LAB — COMPREHENSIVE METABOLIC PANEL
ALBUMIN: 4.8 g/dL (ref 3.5–5.5)
ALK PHOS: 71 IU/L (ref 39–117)
ALT: 21 IU/L (ref 0–32)
AST: 24 IU/L (ref 0–40)
Albumin/Globulin Ratio: 2.3 — ABNORMAL HIGH (ref 1.2–2.2)
BUN / CREAT RATIO: 17 (ref 9–23)
BUN: 14 mg/dL (ref 6–24)
Bilirubin Total: <0.2 mg/dL (ref 0.0–1.2)
CALCIUM: 9 mg/dL (ref 8.7–10.2)
CO2: 23 mmol/L (ref 20–29)
CREATININE: 0.83 mg/dL (ref 0.57–1.00)
Chloride: 102 mmol/L (ref 96–106)
GFR, EST AFRICAN AMERICAN: 93 mL/min/{1.73_m2} (ref >59)
GFR, EST NON AFRICAN AMERICAN: 81 mL/min/{1.73_m2} (ref >59)
GLOBULIN, TOTAL: 2.1 g/dL (ref 1.5–4.5)
Glucose: 80 mg/dL (ref 65–99)
Potassium: 4.1 mmol/L (ref 3.5–5.2)
SODIUM: 138 mmol/L (ref 134–144)
TOTAL PROTEIN: 6.9 g/dL (ref 6.0–8.5)

## 2018-01-11 LAB — CBC
HEMATOCRIT: 40.9 % (ref 34.0–46.6)
HEMOGLOBIN: 14.2 g/dL (ref 11.1–15.9)
MCH: 30.7 pg (ref 26.6–33.0)
MCHC: 34.7 g/dL (ref 31.5–35.7)
MCV: 89 fL (ref 79–97)
Platelets: 266 10*3/uL (ref 150–379)
RBC: 4.62 x10E6/uL (ref 3.77–5.28)
RDW: 13.8 % (ref 12.3–15.4)
WBC: 5.5 10*3/uL (ref 3.4–10.8)

## 2018-01-11 LAB — HEMOGLOBIN A1C
Est. average glucose Bld gHb Est-mCnc: 108 mg/dL
Hgb A1c MFr Bld: 5.4 % (ref 4.8–5.6)

## 2018-02-24 ENCOUNTER — Encounter: Payer: Self-pay | Admitting: Obstetrics and Gynecology

## 2018-02-25 ENCOUNTER — Telehealth: Payer: Self-pay | Admitting: Obstetrics and Gynecology

## 2018-02-25 NOTE — Telephone Encounter (Signed)
Call to [facility], spoke with Mardene Celeste, who confirmed that pharmacy could dispense estrace, provera, and prozac as 30 day supply even though all 3 prescriptions had been written as a 90 day supply with 3 refills in March. RN advised would update patient.

## 2018-02-25 NOTE — Telephone Encounter (Signed)
Patient sent the following correspondence through MyChart. Routing to the refill pool to assist patient with request.  ----- Message from Mychart, Generic sent at 02/24/2018 9:07 PM EDT -----    Hello - Per my pharmacy my insurance will not cover the 90 prescription given when I was in in March for the Provera & the Estrace. The prescription must be for 30 days instead, per [facility] they have sent over 3 requests for this (again per [facility]).    Please send new prescriptions as I need this PRIOR to my cruise, May 17th. ALSO, request that [facility] give me at least (3) each presciption pills for the trip, as the insurance, per [facility], will not allow for another refill till   May 26th, a Sunday when I return AND [facility] is closed that day (return flight comes in at 6:30 pm May 26th).     [facility] hours M-F, 8-7, Sat 8-2 and Sun Closed, just and FYI.    Thank you for your help and consideration in this matter.    Have a great week!    Kind Regard - Amy Barr   Patient also sent the following MyChart message:  ----- Message from [facility], Generic sent at 02/24/2018 9:09 PM EDT -----    Hello -  As a side note I will also need a 30 day prescription for the Fluxetine also.    Thank you - Amy Barr

## 2018-02-25 NOTE — Telephone Encounter (Signed)
Call to patient. Advised patient that per Mardene Celeste at [facility], they would dispense medications as 30 day supply. Patient states she had asked if they would do that and they told her no. RN advised if patient had any issues when picking up prescription to return call to office. Patient agreeable and appreciative of phone call.   Routing to provider for final review. Patient agreeable to disposition. Will close encounter.

## 2018-02-28 ENCOUNTER — Other Ambulatory Visit: Payer: Self-pay | Admitting: Obstetrics and Gynecology

## 2018-03-01 ENCOUNTER — Telehealth: Payer: Self-pay | Admitting: Obstetrics and Gynecology

## 2018-03-01 MED ORDER — MEDROXYPROGESTERONE ACETATE 5 MG PO TABS: 5.0000 mg | ORAL_TABLET | Freq: Every day | ORAL | 3 refills | Status: DC

## 2018-03-01 NOTE — Telephone Encounter (Signed)
Patient calling to check status of the refill request for Provera. Was not sent to pharmacy.

## 2018-03-01 NOTE — Telephone Encounter (Signed)
Spoke with Zack at [facility]. Was advised no Rx on file for medroxyprogesterone (Provera)  tab.   New Rx sent for Provera  to [facility] #90/3RF.  Call returned to patient, left detailed message, ok per dpr. Advised as seen above, f/u with pharmacy for filling. Return call with any additional questions.   Routing to provider for final review. Patient is agreeable to disposition. Will close encounter.

## 2018-03-01 NOTE — Telephone Encounter (Signed)
Patient states she has contacted [facility] to fill provera RX sent on 01/10/18, was advised by pharmacy no RX on file. Requesting Rx. Advised patient I will contact [facility] and return call.

## 2018-04-01 ENCOUNTER — Other Ambulatory Visit: Payer: Self-pay | Admitting: Obstetrics and Gynecology

## 2018-04-01 DIAGNOSIS — Z1231 Encounter for screening mammogram for malignant neoplasm of breast: Secondary | ICD-10-CM

## 2018-04-24 ENCOUNTER — Ambulatory Visit
Admission: RE | Admit: 2018-04-24 | Discharge: 2018-04-24 | Disposition: A | Discharge status: Home / Self Care | Payer: PRIVATE HEALTH INSURANCE | Source: Ambulatory Visit | Attending: Obstetrics and Gynecology | Admitting: Obstetrics and Gynecology

## 2018-04-24 DIAGNOSIS — Z1231 Encounter for screening mammogram for malignant neoplasm of breast: Secondary | ICD-10-CM

## 2019-01-14 ENCOUNTER — Other Ambulatory Visit: Payer: Self-pay | Admitting: Obstetrics and Gynecology

## 2019-01-14 NOTE — Telephone Encounter (Signed)
Patient notified. Verbalized understanding.  States she will call her doctor's office Dr. Santiago Glad.   She also needs refills on provera last refill was 03/01/18 #90/3R. Today #90/0R?

## 2019-01-14 NOTE — Telephone Encounter (Signed)
Medication refill request: prozac and estrace  Last AEX:  01/10/18 JJ Next AEX: 02/20/19  Last MMG (if hormonal medication request): 04/24/18 [id number]:neg  Refill authorized: 01/10/18 #90/3R.  Today please #90/0R?

## 2019-01-14 NOTE — Telephone Encounter (Signed)
Please let the patient know that the prozac can increase the levels of thorazine (takes for migraines), she should discuss the use of thorazine and prozac with the provider who prescribes the thorazine for prn use.  She should have enough progesterone to get her through to May, 2020

## 2019-01-15 MED ORDER — MEDROXYPROGESTERONE ACETATE 5 MG PO TABS: 5.0000 mg | ORAL_TABLET | Freq: Every day | ORAL | 0 refills | Status: DC

## 2019-02-03 ENCOUNTER — Ambulatory Visit: Payer: PRIVATE HEALTH INSURANCE | Admitting: Obstetrics and Gynecology

## 2019-02-20 ENCOUNTER — Ambulatory Visit: Payer: PRIVATE HEALTH INSURANCE | Admitting: Obstetrics and Gynecology

## 2019-04-07 ENCOUNTER — Other Ambulatory Visit: Payer: Self-pay | Admitting: Obstetrics and Gynecology

## 2019-04-07 DIAGNOSIS — Z1231 Encounter for screening mammogram for malignant neoplasm of breast: Secondary | ICD-10-CM

## 2019-04-13 ENCOUNTER — Encounter: Payer: Self-pay | Admitting: Obstetrics and Gynecology

## 2019-04-13 ENCOUNTER — Other Ambulatory Visit: Payer: Self-pay | Admitting: Obstetrics and Gynecology

## 2019-04-13 MED ORDER — FLUOXETINE HCL 10 MG PO CAPS: 10.0000 mg | ORAL_CAPSULE | Freq: Every day | ORAL | 0 refills | Status: DC

## 2019-04-13 MED ORDER — MEDROXYPROGESTERONE ACETATE 5 MG PO TABS: 5.0000 mg | ORAL_TABLET | Freq: Every day | ORAL | 0 refills | Status: DC

## 2019-04-13 MED ORDER — ESTRADIOL 0.5 MG PO TABS: 0.5000 mg | ORAL_TABLET | Freq: Every day | ORAL | 0 refills | Status: DC

## 2019-04-14 ENCOUNTER — Other Ambulatory Visit: Payer: Self-pay | Admitting: Obstetrics and Gynecology

## 2019-04-14 NOTE — Telephone Encounter (Signed)
Patient's medications have been sent to the pharmacy on [address]. Patient has been notified.

## 2019-04-14 NOTE — Telephone Encounter (Signed)
Patient sent the following correspondence through [place]. Routing to refill pool to assist patient with request.  Hello, I have requested prescription refills for all three of my medications. Due to Covid-19, my yearly exam was moved from April to July. Please refill till I can come in to see Dr. Talbert Nan July 27th, 2020.    Thank you -     Amy Barr

## 2019-05-14 NOTE — Progress Notes (Signed)
53 y.o. [id number] Widowed White or Caucasian Not Hispanic or Latino female here for annual exam.   She has a new partner for the last 8 months. Doing really well. No dyspareunia.  She is on HRT, doing well, she is hot all the time but no hot flashes or night sweats.     Patient's last menstrual period was 05/12/2014 (exact date).          Sexually active: Yes.    The current method of family planning is post menopausal status.    Exercising: Yes walking, weights, video workouts Smoker:  no  Health Maintenance: Pap:  09-20-15 WNL NEG HR HPV, 08-27-12 WNL NEG HR HPV  History of abnormal Pap:  Yes had cryosurgrey  MMG:  04/24/2018 Birads 1 negative Colonoscopy:  12-17-15 polyps repeat in 5 yrs BMD:   Never TDaP:  07-25-11 Gardasil: N/A   reports that she quit smoking about 7 years ago. Her smoking use included cigarettes. She has a 15.00 pack-year smoking history. She has never used smokeless tobacco. She reports current alcohol use of about 8.0 standard drinks of alcohol per week. She reports that she does not use drugs.  Husband died in 8/18 of a stroke at 8963. Not working, furloughed prior to covid. 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 May not go back to work at all or work from home.   Past Medical History:  Diagnosis Date  . Anxiety   . GERD (gastroesophageal reflux disease)   . History of adenomatous polyp of colon    12-17-2015  TUBULAR ADENOMA AND HYPERPLASTIC  . History of cardiac murmur    per pt told was mild at 1989 surgery  . History of multiple concussions    per pt last one 1992 , approx.,  residual migraines which is along with hereditary migraines  . Migraine   . PMB (postmenopausal bleeding)   . Wears glasses     Past Surgical History:  Procedure Laterality Date  . CARPAL TUNNEL RELEASE Right 01/29/2009  . COLONOSCOPY  12/17/2015  . DILATATION & CURETTAGE/HYSTEROSCOPY WITH MYOSURE N/A 03/26/2017   Procedure: DILATATION & CURETTAGE/HYSTEROSCOPY WITH MYOSURE;   Surgeon: Romualdo BolkJertson, Estera Ozier Evelyn, MD;  [facility] [place];  Service: Gynecology;  Laterality: N/A;  . MANDIBLE SURGERY  1989   complete mandible reconstruction for previous injury as younger child--  per pt retained hardware  . NASAL SINUS SURGERY  2002  . ROTATOR CUFF REPAIR Right 1992    Current Outpatient Medications  Medication Sig Dispense Refill  . b complex vitamins tablet Take 1 tablet by mouth daily.    . chlorproMAZINE (THORAZINE) 25 MG tablet Take 1-2 tablets by mouth as needed.   1  . estradiol (ESTRACE) 0.5 MG tablet Take 1 tablet (0.5 mg total) by mouth daily. 90 tablet 0  . FLUoxetine (PROZAC) 10 MG capsule Take 1 capsule (10 mg total) by mouth daily. 90 capsule 0  . medroxyPROGESTERone (PROVERA) 5 MG tablet Take 1 tablet (5 mg total) by mouth daily. 90 tablet 0  . Melatonin 5 MG CAPS Take by mouth at bedtime.     . Misc Natural Products (OSTEO BI-FLEX ADV DOUBLE ST PO) Take by mouth.    . Multiple Vitamin (MULTIVITAMIN) capsule Take 1 capsule by mouth daily.    Marland Kitchen. omeprazole (PRILOSEC) 40 MG capsule Take 40 mg by mouth every morning.     . Probiotic Product (PHILLIPS COLON HEALTH PO) Take by mouth.    Arvin Collard. QUDEXY  XR 200 MG CS24 Take 2 capsules by mouth every morning.   5  . zolmitriptan (ZOMIG) 5 MG nasal solution Place into the nose as needed for migraine.     No current facility-administered medications for this visit.     Family History  Problem Relation Age of Onset  . Alzheimer's disease Mother   . Hyperlipidemia Mother   . Thyroid disease Mother   . Diabetes Father   . Stroke Father   . Hashimoto's thyroiditis Sister     Review of Systems  Eyes: Negative.   Respiratory: Negative.   Cardiovascular: Negative.   Gastrointestinal: Negative.   Endocrine: Negative.   Genitourinary:       Trouble starting flow with urination  Musculoskeletal: Negative.   Skin: Negative.   Allergic/Immunologic: Negative.   Neurological: Negative.    Hematological: Negative.   Psychiatric/Behavioral: Negative.   Feels she is emptying. Flow is normal. No trouble voiding in the night (up a couple of times at night, normal amounts). No bulge noted. Drinks 2 gallons of water a day. No frequency, urgency or leakage.   Bowel movements are fine.   Exam:   BP 108/78 (BP Location: Right Arm, Patient Position: Sitting, Cuff Size: Normal)   Pulse 60   Temp 97.8 F (36.6 C) (Skin)   Ht 5' 2.21" (1.58 m)   Wt 148 lb 9.6 oz (67.4 kg)   LMP 05/12/2014 (Exact Date)   BMI 27.00 kg/m   Weight change: @WEIGHTCHANGE @ Height:   Height: 5' 2.21" (158 cm)  Ht Readings from Last 3 Encounters:  05/19/19 5' 2.21" (1.58 m)  01/10/18 5\' 2"  (1.575 m)  03/26/17 5' 3.5" (1.613 m)    General appearance: alert, cooperative and appears stated age Head: Normocephalic, without obvious abnormality, atraumatic Neck: no adenopathy, supple, symmetrical, trachea midline and thyroid normal to inspection and palpation Lungs: clear to auscultation bilaterally Cardiovascular: regular rate and rhythm Breasts: normal appearance, no masses or tenderness Abdomen: soft, non-tender; non distended,  no masses,  no organomegaly Extremities: extremities normal, atraumatic, no cyanosis or edema Skin: Skin color, texture, turgor normal. No rashes or lesions Lymph nodes: Cervical, supraclavicular, and axillary nodes normal. No abnormal inguinal nodes palpated Neurologic: Grossly normal   Pelvic: External genitalia:  no lesions              Urethra:  normal appearing urethra with no masses, tenderness or lesions              Bartholins and Skenes: normal                 Vagina: normal appearing vagina with normal color and discharge, no lesions              Cervix: no lesions               Bimanual Exam:  Uterus:  normal size, contour, position, consistency, mobility, non-tender              Adnexa: no mass, fullness, tenderness               Rectovaginal: Confirms                Anus:  normal sphincter tone, no lesions  Chaperone was present for exam.  A:  Well Woman with normal exam  HRT, wants to continue, understands the risks. Will consider coming off next year  Hesitancy to void, no significant prolapse  P:   Pap in 2021  Mammogram due  Colonoscopy UTD  Discussed breast self exam  Discussed calcium and vit D intake  Check urine, if symptoms worsen will refer to Urology

## 2019-05-15 ENCOUNTER — Other Ambulatory Visit: Payer: Self-pay

## 2019-05-19 ENCOUNTER — Ambulatory Visit (INDEPENDENT_AMBULATORY_CARE_PROVIDER_SITE_OTHER): Payer: PRIVATE HEALTH INSURANCE | Admitting: Obstetrics and Gynecology

## 2019-05-19 ENCOUNTER — Other Ambulatory Visit: Payer: Self-pay

## 2019-05-19 ENCOUNTER — Encounter: Payer: Self-pay | Admitting: Obstetrics and Gynecology

## 2019-05-19 VITALS — BP 108/78 | HR 60 | Temp 97.8°F | Ht 62.21 in | Wt 148.6 lb

## 2019-05-19 DIAGNOSIS — R3911 Hesitancy of micturition: Secondary | ICD-10-CM

## 2019-05-19 DIAGNOSIS — Z0000 Encounter for general adult medical examination without abnormal findings: Secondary | ICD-10-CM

## 2019-05-19 DIAGNOSIS — Z113 Encounter for screening for infections with a predominantly sexual mode of transmission: Secondary | ICD-10-CM | POA: Diagnosis not present

## 2019-05-19 DIAGNOSIS — Z79890 Hormone replacement therapy (postmenopausal): Secondary | ICD-10-CM | POA: Diagnosis not present

## 2019-05-19 DIAGNOSIS — Z01419 Encounter for gynecological examination (general) (routine) without abnormal findings: Secondary | ICD-10-CM | POA: Diagnosis not present

## 2019-05-19 DIAGNOSIS — E559 Vitamin D deficiency, unspecified: Secondary | ICD-10-CM

## 2019-05-19 DIAGNOSIS — Z833 Family history of diabetes mellitus: Secondary | ICD-10-CM

## 2019-05-19 MED ORDER — FLUOXETINE HCL 10 MG PO CAPS: 10.0000 mg | ORAL_CAPSULE | Freq: Every day | ORAL | 3 refills | Status: DC

## 2019-05-19 MED ORDER — ESTRADIOL 0.5 MG PO TABS: 0.5000 mg | ORAL_TABLET | Freq: Every day | ORAL | 3 refills | Status: DC

## 2019-05-19 MED ORDER — MEDROXYPROGESTERONE ACETATE 5 MG PO TABS: 5.0000 mg | ORAL_TABLET | Freq: Every day | ORAL | 3 refills | Status: DC

## 2019-05-19 NOTE — Patient Instructions (Signed)

## 2019-05-20 LAB — URINE CULTURE

## 2019-05-20 LAB — URINALYSIS, MICROSCOPIC ONLY
Bacteria, UA: NONE SEEN
Casts: NONE SEEN /lpf

## 2019-05-21 LAB — COMPREHENSIVE METABOLIC PANEL
ALT: 15 IU/L (ref 0–32)
AST: 20 IU/L (ref 0–40)
Albumin/Globulin Ratio: 1.8 (ref 1.2–2.2)
Albumin: 4.1 g/dL (ref 3.8–4.9)
Alkaline Phosphatase: 59 IU/L (ref 39–117)
BUN/Creatinine Ratio: 13 (ref 9–23)
BUN: 11 mg/dL (ref 6–24)
Bilirubin Total: 0.2 mg/dL (ref 0.0–1.2)
CO2: 18 mmol/L — ABNORMAL LOW (ref 20–29)
Calcium: 8.4 mg/dL — ABNORMAL LOW (ref 8.7–10.2)
Chloride: 96 mmol/L (ref 96–106)
Creatinine, Ser: 0.82 mg/dL (ref 0.57–1.00)
GFR calc Af Amer: 93 mL/min/{1.73_m2} (ref >59)
GFR calc non Af Amer: 81 mL/min/{1.73_m2} (ref >59)
Globulin, Total: 2.3 g/dL (ref 1.5–4.5)
Glucose: 89 mg/dL (ref 65–99)
Potassium: 3.8 mmol/L (ref 3.5–5.2)
Sodium: 133 mmol/L — ABNORMAL LOW (ref 134–144)
Total Protein: 6.4 g/dL (ref 6.0–8.5)

## 2019-05-21 LAB — CBC
Hematocrit: 39.7 % (ref 34.0–46.6)
Hemoglobin: 13.9 g/dL (ref 11.1–15.9)
MCH: 31.9 pg (ref 26.6–33.0)
MCHC: 35 g/dL (ref 31.5–35.7)
MCV: 91 fL (ref 79–97)
Platelets: 253 10*3/uL (ref 150–450)
RBC: 4.36 x10E6/uL (ref 3.77–5.28)
RDW: 12.2 % (ref 11.7–15.4)
WBC: 4.3 10*3/uL (ref 3.4–10.8)

## 2019-05-21 LAB — LIPID PANEL
Chol/HDL Ratio: 4.4 ratio (ref 0.0–4.4)
Cholesterol, Total: 212 mg/dL — ABNORMAL HIGH (ref 100–199)
HDL: 48 mg/dL (ref >39)
LDL Calculated: 106 mg/dL — ABNORMAL HIGH (ref 0–99)
Triglycerides: 291 mg/dL — ABNORMAL HIGH (ref 0–149)
VLDL Cholesterol Cal: 58 mg/dL — ABNORMAL HIGH (ref 5–40)

## 2019-05-21 LAB — HIV ANTIBODY (ROUTINE TESTING W REFLEX): HIV Screen 4th Generation wRfx: NONREACTIVE

## 2019-05-21 LAB — HEMOGLOBIN A1C
Est. average glucose Bld gHb Est-mCnc: 100 mg/dL
Hgb A1c MFr Bld: 5.1 % (ref 4.8–5.6)

## 2019-05-21 LAB — VITAMIN D 25 HYDROXY (VIT D DEFICIENCY, FRACTURES): Vit D, 25-Hydroxy: 41.3 ng/mL (ref 30.0–100.0)

## 2019-05-21 LAB — RPR: RPR Ser Ql: NONREACTIVE

## 2019-05-23 ENCOUNTER — Ambulatory Visit: Payer: PRIVATE HEALTH INSURANCE

## 2019-05-23 LAB — GC/CHLAMYDIA PROBE AMP
Chlamydia trachomatis, NAA: NEGATIVE
Neisseria Gonorrhoeae by PCR: NEGATIVE

## 2019-06-16 ENCOUNTER — Ambulatory Visit: Payer: PRIVATE HEALTH INSURANCE

## 2019-06-19 ENCOUNTER — Ambulatory Visit: Payer: PRIVATE HEALTH INSURANCE

## 2019-07-28 ENCOUNTER — Other Ambulatory Visit: Payer: Self-pay

## 2019-07-28 ENCOUNTER — Ambulatory Visit
Admission: RE | Admit: 2019-07-28 | Discharge: 2019-07-28 | Disposition: A | Discharge status: Home / Self Care | Payer: BC Managed Care – PPO | Source: Ambulatory Visit | Attending: Obstetrics and Gynecology | Admitting: Obstetrics and Gynecology

## 2019-07-28 DIAGNOSIS — Z1231 Encounter for screening mammogram for malignant neoplasm of breast: Secondary | ICD-10-CM | POA: Diagnosis not present

## 2019-08-22 DIAGNOSIS — M25561 Pain in right knee: Secondary | ICD-10-CM | POA: Diagnosis not present

## 2019-10-03 DIAGNOSIS — M25561 Pain in right knee: Secondary | ICD-10-CM | POA: Diagnosis not present

## 2019-11-18 DIAGNOSIS — Z1331 Encounter for screening for depression: Secondary | ICD-10-CM | POA: Diagnosis not present

## 2019-11-18 DIAGNOSIS — Z13220 Encounter for screening for lipoid disorders: Secondary | ICD-10-CM | POA: Diagnosis not present

## 2019-11-18 DIAGNOSIS — Z136 Encounter for screening for cardiovascular disorders: Secondary | ICD-10-CM | POA: Diagnosis not present

## 2019-11-18 DIAGNOSIS — Z6826 Body mass index (BMI) 26.0-26.9, adult: Secondary | ICD-10-CM | POA: Diagnosis not present

## 2019-11-18 DIAGNOSIS — M1990 Unspecified osteoarthritis, unspecified site: Secondary | ICD-10-CM | POA: Diagnosis not present

## 2019-12-02 ENCOUNTER — Telehealth: Payer: Self-pay | Admitting: Obstetrics and Gynecology

## 2019-12-02 DIAGNOSIS — G43719 Chronic migraine without aura, intractable, without status migrainosus: Secondary | ICD-10-CM | POA: Diagnosis not present

## 2019-12-02 DIAGNOSIS — G43111 Migraine with aura, intractable, with status migrainosus: Secondary | ICD-10-CM | POA: Diagnosis not present

## 2019-12-02 NOTE — Telephone Encounter (Signed)
Spoke to pt. Pt states wanting to know when to stop provera and estrace prior surgery for total knee replacement in April. Pt states wanting to go off completely after surgery due to being on for last 7-8 years. Will discuss with Dr Oscar La and return call to pt. Pt agreeable.   Routing to Dr Oscar La for recommendations and advice.

## 2019-12-02 NOTE — Telephone Encounter (Signed)
Patient is having total knee replacement in April and would like to know when she should go off of her two estrogen medications prior to surgery. States she is aware if she has these in her system when going into surgery, there is a higher risk of stroke. She would like to stay off of them after the surgery is over as well, as she has been on them for 7-8 years. Would like to discuss with a nurse.

## 2019-12-03 NOTE — Telephone Encounter (Signed)
Please let the patient know that she should go off of HRT at least 2 weeks prior to her surgery. If she is planning on staying off, she might want to stop now. That will give her time to adjust to any new vasomotor symptoms.

## 2019-12-03 NOTE — Telephone Encounter (Signed)
Call to patient, left detailed message, ok per dpr. Advised as seen below per Dr. Jertson.  Return call to office if any additional questions.  Encounter closed.  

## 2020-02-02 DIAGNOSIS — M25561 Pain in right knee: Secondary | ICD-10-CM | POA: Diagnosis not present

## 2020-02-02 DIAGNOSIS — Z01812 Encounter for preprocedural laboratory examination: Secondary | ICD-10-CM | POA: Diagnosis not present

## 2020-02-12 DIAGNOSIS — M1711 Unilateral primary osteoarthritis, right knee: Secondary | ICD-10-CM | POA: Diagnosis not present

## 2020-05-17 NOTE — Progress Notes (Signed)
54 y.o. [id number] Widowed White or Caucasian Not Hispanic or Latino female here for annual exam. Sexually active, same partner for over 1.5 years, plan to move in together. No dyspareunia, uses a lubricant. No vaginal bleeding.   She had a right knee replacement in 4/21, doing great.   She went off of her HRT prior to her surgery. At night she is so hot, mild sweating. Not sleeping well because of it. She has mild hot sweats during the day.      Patient's last menstrual period was 05/12/2014 (exact date).          Sexually active: Yes.    The current method of family planning is post menopausal status.    Exercising: Yes.    elliptical, weights, aerobics Smoker:  no  Health Maintenance: Pap: 09-20-15 WNL NEG HR HPV, 08-27-12 WNL NEG HR HPV  History of abnormal Pap:  Yes had cryosurgery  MMG:  07/28/19 density B Bi-rads 1 neg  BMD:   Never  Colonoscopy: 12-17-15 polyps repeat in 5 yrs TDaP:  07/25/11 Gardasil: NA   reports that she quit smoking about 8 years ago. Her smoking use included cigarettes. She has a 15.00 pack-year smoking history. She has never used smokeless tobacco. She reports current alcohol use of about 8.0 standard drinks of alcohol per week. She reports that she does not use drugs. Husband died in 07/07/23 of a stroke at 32. 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   Past Medical History:  Diagnosis Date  . Anxiety   . GERD (gastroesophageal reflux disease)   . History of adenomatous polyp of colon    12-17-2015  TUBULAR ADENOMA AND HYPERPLASTIC  . History of cardiac murmur    per pt told was mild at 1989 surgery  . History of multiple concussions    per pt last one 1992 , approx.,  residual migraines which is along with hereditary migraines  . Migraine   . PMB (postmenopausal bleeding)   . Wears glasses     Past Surgical History:  Procedure Laterality Date  . CARPAL TUNNEL RELEASE Right 01/29/2009  . COLONOSCOPY  12/17/2015  .  DILATATION & CURETTAGE/HYSTEROSCOPY WITH MYOSURE N/A 03/26/2017   Procedure: DILATATION & CURETTAGE/HYSTEROSCOPY WITH MYOSURE;  Surgeon: Romualdo Bolk, MD;  Location: [facility];  Service: Gynecology;  Laterality: N/A;  . MANDIBLE SURGERY  1989   complete mandible reconstruction for previous injury as younger child--  per pt retained hardware  . NASAL SINUS SURGERY  2002  . REPLACEMENT TOTAL KNEE Right   . REPLACEMENT TOTAL KNEE Right    4/21  . ROTATOR CUFF REPAIR Right 1992    Current Outpatient Medications  Medication Sig Dispense Refill  . chlorproMAZINE (THORAZINE) 25 MG tablet Take 1-2 tablets by mouth as needed.   1  . FLUoxetine (PROZAC) 10 MG capsule Take 1 capsule (10 mg total) by mouth daily. 90 capsule 3  . GOODSENSE PAIN RELIEF EXTRA ST 500 MG tablet Take 1,000 mg by mouth every 8 (eight) hours as needed.    . Melatonin 5 MG CAPS Take by mouth at bedtime.     . Multiple Vitamin (MULTIVITAMIN) capsule Take 1 capsule by mouth daily.    Marland Kitchen omeprazole (PRILOSEC) 40 MG capsule Take 40 mg by mouth every morning.     . Probiotic Product (PHILLIPS COLON HEALTH PO) Take by mouth.    . QUDEXY XR 200 MG CS24 Take 2  capsules by mouth every morning.   5  . vitamin B-12 (CYANOCOBALAMIN) 100 MCG tablet Take 100 mcg by mouth daily.    Marland Kitchen zolmitriptan (ZOMIG) 5 MG nasal solution Place into the nose as needed for migraine.     No current facility-administered medications for this visit.    Family History  Problem Relation Age of Onset  . Alzheimer's disease Mother   . Hyperlipidemia Mother   . Thyroid disease Mother   . Diabetes Father   . Stroke Father   . Hashimoto's thyroiditis Sister     Review of Systems  Constitutional:       Hot flashes  HENT: Negative.   Eyes: Negative.   Respiratory: Negative.   Cardiovascular: Negative.   Gastrointestinal: Negative.   Endocrine: Negative.   Genitourinary: Negative.   Musculoskeletal: Negative.   Skin: Negative.    Allergic/Immunologic: Negative.   Neurological: Negative.   Hematological: Negative.   Psychiatric/Behavioral: Negative.     Exam:   BP (!) 118/62 (BP Location: Right Arm, Patient Position: Sitting, Cuff Size: Normal)   Pulse 71   Ht 5' 2.5" (1.588 m)   Wt 144 lb (65.3 kg)   LMP 05/12/2014 (Exact Date)   SpO2 100%   BMI 25.92 kg/m   Weight change: @WEIGHTCHANGE @ Height:   Height: 5' 2.5" (158.8 cm)  Ht Readings from Last 3 Encounters:  05/19/20 5' 2.5" (1.588 m)  05/19/19 5' 2.21" (1.58 m)  01/10/18 5\' 2"  (1.575 m)    General appearance: alert, cooperative and appears stated age Head: Normocephalic, without obvious abnormality, atraumatic Neck: no adenopathy, supple, symmetrical, trachea midline and thyroid normal to inspection and palpation Lungs: clear to auscultation bilaterally Cardiovascular: regular rate and rhythm Breasts: normal appearance, no masses or tenderness Abdomen: soft, non-tender; non distended,  no masses,  no organomegaly Extremities: extremities normal, atraumatic, no cyanosis or edema Skin: Skin color, texture, turgor normal. No rashes or lesions Lymph nodes: Cervical, supraclavicular, and axillary nodes normal. No abnormal inguinal nodes palpated Neurologic: Grossly normal   Pelvic: External genitalia:  no lesions              Urethra:  normal appearing urethra with no masses, tenderness or lesions              Bartholins and Skenes: normal                 Vagina: mildly atrophic appearing vagina with normal color and discharge, no lesions              Cervix: no lesions               Bimanual Exam:  Uterus:  normal size, contour, position, consistency, mobility, non-tender              Adnexa: no mass, fullness, tenderness               Rectovaginal: Confirms               Anus:  normal sphincter tone, no lesions  01/12/18 chaperoned for the exam.  A:  Well Woman with normal exam  Having night sweats, difficulty sleeping off of  HRT.  H/O anxiety, controlled on Prozac  P:   Pap with hpv  Discussed options for treatment of night sweats. She will try estroven pm. We discussed the option of gabapentin or restarting HRT. She did use gabapentin around the time of her knee replacement.   Prozac refilled, notified that prozac can  increase the concentration of thorazine (she will discuss with her other MD)  Mammogram in the fall  Colonoscopy next year  Labs UTD with primary and ortho  Discussed breast self exam  Discussed calcium and vit D intake

## 2020-05-19 ENCOUNTER — Encounter: Payer: Self-pay | Admitting: Obstetrics and Gynecology

## 2020-05-19 ENCOUNTER — Other Ambulatory Visit: Payer: Self-pay

## 2020-05-19 ENCOUNTER — Ambulatory Visit (INDEPENDENT_AMBULATORY_CARE_PROVIDER_SITE_OTHER): Payer: BC Managed Care – PPO | Admitting: Obstetrics and Gynecology

## 2020-05-19 VITALS — BP 118/62 | HR 71 | Ht 62.5 in | Wt 144.0 lb

## 2020-05-19 DIAGNOSIS — Z780 Asymptomatic menopausal state: Secondary | ICD-10-CM | POA: Diagnosis not present

## 2020-05-19 DIAGNOSIS — R61 Generalized hyperhidrosis: Secondary | ICD-10-CM | POA: Diagnosis not present

## 2020-05-19 DIAGNOSIS — Z01419 Encounter for gynecological examination (general) (routine) without abnormal findings: Secondary | ICD-10-CM | POA: Diagnosis not present

## 2020-05-19 MED ORDER — FLUOXETINE HCL 10 MG PO CAPS: 10.0000 mg | ORAL_CAPSULE | Freq: Every day | ORAL | 3 refills | Status: DC

## 2020-05-19 NOTE — Patient Instructions (Signed)
EXERCISE AND DIET:  We recommended that you start or continue a regular exercise program for good health. Regular exercise means any activity that makes your heart beat faster and makes you sweat.  We recommend exercising at least 30 minutes per day at least 3 days a week, preferably 4 or 5.  We also recommend a diet low in fat and sugar.  Inactivity, poor dietary choices and obesity can cause diabetes, heart attack, stroke, and kidney damage, among others.    ALCOHOL AND SMOKING:  Women should limit their alcohol intake to no more than 7 drinks/beers/glasses of wine (combined, not each!) per week. Moderation of alcohol intake to this level decreases your risk of breast cancer and liver damage. And of course, no recreational drugs are part of a healthy lifestyle.  And absolutely no smoking or even second hand smoke. Most people know smoking can cause heart and lung diseases, but did you know it also contributes to weakening of your bones? Aging of your skin?  Yellowing of your teeth and nails?  CALCIUM AND VITAMIN D:  Adequate intake of calcium and Vitamin D are recommended.  The recommendations for exact amounts of these supplements seem to change often, but generally speaking 1,200 mg of calcium (between diet and supplement) and 800 units of Vitamin D per day seems prudent. Certain women may benefit from higher intake of Vitamin D.  If you are among these women, your doctor will have told you during your visit.    PAP SMEARS:  Pap smears, to check for cervical cancer or precancers,  have traditionally been done yearly, although recent scientific advances have shown that most women can have pap smears less often.  However, every woman still should have a physical exam from her gynecologist every year. It will include a breast check, inspection of the vulva and vagina to check for abnormal growths or skin changes, a visual exam of the cervix, and then an exam to evaluate the size and shape of the uterus and  ovaries.  And after 56 years of age, a rectal exam is indicated to check for rectal cancers. We will also provide age appropriate advice regarding health maintenance, like when you should have certain vaccines, screening for sexually transmitted diseases, bone density testing, colonoscopy, mammograms, etc.   MAMMOGRAMS:  All women over 40 years old should have a yearly mammogram. Many facilities now offer a "3D" mammogram, which may cost around $50 extra out of pocket. If possible,  we recommend you accept the option to have the 3D mammogram performed.  It both reduces the number of women who will be called back for extra views which then turn out to be normal, and it is better than the routine mammogram at detecting truly abnormal areas.    COLON CANCER SCREENING: Now recommend starting at age 45. At this time colonoscopy is not covered for routine screening until 50. There are take home tests that can be done between 45-49.   COLONOSCOPY:  Colonoscopy to screen for colon cancer is recommended for all women at age 50.  We know, you hate the idea of the prep.  We agree, BUT, having colon cancer and not knowing it is worse!!  Colon cancer so often starts as a polyp that can be seen and removed at colonscopy, which can quite literally save your life!  And if your first colonoscopy is normal and you have no family history of colon cancer, most women don't have to have it again for   10 years.  Once every ten years, you can do something that may end up saving your life, right?  We will be happy to help you get it scheduled when you are ready.  Be sure to check your insurance coverage so you understand how much it will cost.  It may be covered as a preventative service at no cost, but you should check your particular policy.      Breast Self-Awareness Breast self-awareness means being familiar with how your breasts look and feel. It involves checking your breasts regularly and reporting any changes to your  health care provider. Practicing breast self-awareness is important. A change in your breasts can be a sign of a serious medical problem. Being familiar with how your breasts look and feel allows you to find any problems early, when treatment is more likely to be successful. All women should practice breast self-awareness, including women who have had breast implants. How to do a breast self-exam One way to learn what is normal for your breasts and whether your breasts are changing is to do a breast self-exam. To do a breast self-exam: Look for Changes  1. Remove all the clothing above your waist. 2. Stand in front of a mirror in a room with good lighting. 3. Put your hands on your hips. 4. Push your hands firmly downward. 5. Compare your breasts in the mirror. Look for differences between them (asymmetry), such as: ? Differences in shape. ? Differences in size. ? Puckers, dips, and bumps in one breast and not the other. 6. Look at each breast for changes in your skin, such as: ? Redness. ? Scaly areas. 7. Look for changes in your nipples, such as: ? Discharge. ? Bleeding. ? Dimpling. ? Redness. ? A change in position. Feel for Changes Carefully feel your breasts for lumps and changes. It is best to do this while lying on your back on the floor and again while sitting or standing in the shower or tub with soapy water on your skin. Feel each breast in the following way:  Place the arm on the side of the breast you are examining above your head.  Feel your breast with the other hand.  Start in the nipple area and make  inch (2 cm) overlapping circles to feel your breast. Use the pads of your three middle fingers to do this. Apply light pressure, then medium pressure, then firm pressure. The light pressure will allow you to feel the tissue closest to the skin. The medium pressure will allow you to feel the tissue that is a little deeper. The firm pressure will allow you to feel the tissue  close to the ribs.  Continue the overlapping circles, moving downward over the breast until you feel your ribs below your breast.  Move one finger-width toward the center of the body. Continue to use the  inch (2 cm) overlapping circles to feel your breast as you move slowly up toward your collarbone.  Continue the up and down exam using all three pressures until you reach your armpit.  Write Down What You Find  Write down what is normal for each breast and any changes that you find. Keep a written record with breast changes or normal findings for each breast. By writing this information down, you do not need to depend only on memory for size, tenderness, or location. Write down where you are in your menstrual cycle, if you are still menstruating. If you are having trouble noticing differences   in your breasts, do not get discouraged. With time you will become more familiar with the variations in your breasts and more comfortable with the exam. How often should I examine my breasts? Examine your breasts every month. If you are breastfeeding, the best time to examine your breasts is after a feeding or after using a breast pump. If you menstruate, the best time to examine your breasts is 5-7 days after your period is over. During your period, your breasts are lumpier, and it may be more difficult to notice changes. When should I see my health care provider? See your health care provider if you notice:  A change in shape or size of your breasts or nipples.  A change in the skin of your breast or nipples, such as a reddened or scaly area.  Unusual discharge from your nipples.  A lump or thick area that was not there before.  Pain in your breasts.  Anything that concerns you.  Menopause Menopause is the normal time of life when menstrual periods stop completely. It is usually confirmed by 12 months without a menstrual period. The transition to menopause (perimenopause) most often happens  between the ages of 5 and 34. During perimenopause, hormone levels change in your body, which can cause symptoms and affect your health. Menopause may increase your risk for:  Loss of bone (osteoporosis), which causes bone breaks (fractures).  Depression.  Hardening and narrowing of the arteries (atherosclerosis), which can cause heart attacks and strokes. What are the causes? This condition is usually caused by a natural change in hormone levels that happens as you get older. The condition may also be caused by surgery to remove both ovaries (bilateral oophorectomy). What increases the risk? This condition is more likely to start at an earlier age if you have certain medical conditions or treatments, including:  A tumor of the pituitary gland in the brain.  A disease that affects the ovaries and hormone production.  Radiation treatment for cancer.  Certain cancer treatments, such as chemotherapy or hormone (anti-estrogen) therapy.  Heavy smoking and excessive alcohol use.  Family history of early menopause. This condition is also more likely to develop earlier in women who are very thin. What are the signs or symptoms? Symptoms of this condition include:  Hot flashes.  Irregular menstrual periods.  Night sweats.  Changes in feelings about sex. This could be a decrease in sex drive or an increased comfort around your sexuality.  Vaginal dryness and thinning of the vaginal walls. This may cause painful intercourse.  Dryness of the skin and development of wrinkles.  Headaches.  Problems sleeping (insomnia).  Mood swings or irritability.  Memory problems.  Weight gain.  Hair growth on the face and chest.  Bladder infections or problems with urinating. How is this diagnosed? This condition is diagnosed based on your medical history, a physical exam, your age, your menstrual history, and your symptoms. Hormone tests may also be done. How is this treated? In some  cases, no treatment is needed. You and your health care provider should make a decision together about whether treatment is necessary. Treatment will be based on your individual condition and preferences. Treatment for this condition focuses on managing symptoms. Treatment may include:  Menopausal hormone therapy (MHT).  Medicines to treat specific symptoms or complications.  Acupuncture.  Vitamin or herbal supplements. Before starting treatment, make sure to let your health care provider know if you have a personal or family history of:  Heart disease.  Breast cancer.  Blood clots.  Diabetes.  Osteoporosis. Follow these instructions at home: Lifestyle  Do not use any products that contain nicotine or tobacco, such as cigarettes and e-cigarettes. If you need help quitting, ask your health care provider.  Get at least 30 minutes of physical activity on 5 or more days each week.  Avoid alcoholic and caffeinated beverages, as well as spicy foods. This may help prevent hot flashes.  Get 7-8 hours of sleep each night.  If you have hot flashes, try: ? Dressing in layers. ? Avoiding things that may trigger hot flashes, such as spicy food, warm places, or stress. ? Taking slow, deep breaths when a hot flash starts. ? Keeping a fan in your home and office.  Find ways to manage stress, such as deep breathing, meditation, or journaling.  Consider going to group therapy with other women who are having menopause symptoms. Ask your health care provider about recommended group therapy meetings. Eating and drinking  Eat a healthy, balanced diet that contains whole grains, lean protein, low-fat dairy, and plenty of fruits and vegetables.  Your health care provider may recommend adding more soy to your diet. Foods that contain soy include tofu, tempeh, and soy milk.  Eat plenty of foods that contain calcium and vitamin D for bone health. Items that are rich in calcium include low-fat  milk, yogurt, beans, almonds, sardines, broccoli, and kale. Medicines  Take over-the-counter and prescription medicines only as told by your health care provider.  Talk with your health care provider before starting any herbal supplements. If prescribed, take vitamins and supplements as told by your health care provider. These may include: ? Calcium. Women age 15 and older should get 1,200 mg (milligrams) of calcium every day. ? Vitamin D. Women need 600-800 International Units of vitamin D each day. ? Vitamins B12 and B6. Aim for 50 micrograms of B12 and 1.5 mg of B6 each day. General instructions  Keep track of your menstrual periods, including: ? When they occur. ? How heavy they are and how long they last. ? How much time passes between periods.  Keep track of your symptoms, noting when they start, how often you have them, and how long they last.  Use vaginal lubricants or moisturizers to help with vaginal dryness and improve comfort during sex.  Keep all follow-up visits as told by your health care provider. This is important. This includes any group therapy or counseling. Contact a health care provider if:  You are still having menstrual periods after age 70.  You have pain during sex.  You have not had a period for 12 months and you develop vaginal bleeding. Get help right away if:  You have: ? Severe depression. ? Excessive vaginal bleeding. ? Pain when you urinate. ? A fast or irregular heart beat (palpitations). ? Severe headaches. ? Abdomen (abdominal) pain or severe indigestion.  You fell and you think you have a broken bone.  You develop leg or chest pain.  You develop vision problems.  You feel a lump in your breast. Summary  Menopause is the normal time of life when menstrual periods stop completely. It is usually confirmed by 12 months without a menstrual period.  The transition to menopause (perimenopause) most often happens between the ages of 38 and  19.  Symptoms can be managed through medicines, lifestyle changes, and complementary therapies such as acupuncture.  Eat a balanced diet that is rich in nutrients to promote bone health and heart  health and to manage symptoms during menopause. This information is not intended to replace advice given to you by your health care provider. Make sure you discuss any questions you have with your health care provider. Document Revised: 09/21/2017 Document Reviewed: 11/11/2016 Elsevier Patient Education  2020 [employer] Inc.  

## 2020-06-11 DIAGNOSIS — G43111 Migraine with aura, intractable, with status migrainosus: Secondary | ICD-10-CM | POA: Diagnosis not present

## 2020-06-11 DIAGNOSIS — G43719 Chronic migraine without aura, intractable, without status migrainosus: Secondary | ICD-10-CM | POA: Diagnosis not present

## 2020-11-30 DIAGNOSIS — G43111 Migraine with aura, intractable, with status migrainosus: Secondary | ICD-10-CM | POA: Diagnosis not present

## 2020-11-30 DIAGNOSIS — G43719 Chronic migraine without aura, intractable, without status migrainosus: Secondary | ICD-10-CM | POA: Diagnosis not present

## 2021-02-09 HISTORY — PX: REPLACEMENT TOTAL KNEE: SUR1224

## 2021-02-23 DIAGNOSIS — M1712 Unilateral primary osteoarthritis, left knee: Secondary | ICD-10-CM | POA: Diagnosis not present

## 2021-03-10 ENCOUNTER — Other Ambulatory Visit: Payer: Self-pay | Admitting: Obstetrics and Gynecology

## 2021-03-10 DIAGNOSIS — Z1231 Encounter for screening mammogram for malignant neoplasm of breast: Secondary | ICD-10-CM

## 2021-05-06 ENCOUNTER — Ambulatory Visit
Admission: RE | Admit: 2021-05-06 | Discharge: 2021-05-06 | Disposition: A | Discharge status: Home / Self Care | Payer: PRIVATE HEALTH INSURANCE | Source: Ambulatory Visit | Attending: Obstetrics and Gynecology | Admitting: Obstetrics and Gynecology

## 2021-05-06 ENCOUNTER — Other Ambulatory Visit: Payer: Self-pay

## 2021-05-06 DIAGNOSIS — Z1231 Encounter for screening mammogram for malignant neoplasm of breast: Secondary | ICD-10-CM | POA: Diagnosis not present

## 2021-05-23 NOTE — Progress Notes (Signed)
55 y.o. G25P0020 Widowed White or Caucasian Not Hispanic or Latino female here for annual exam. She and her partner moved in together last year, going well. No dyspareunia. No vaginal bleeding.   She c/o a 3 day h/o LLQ abdominal/pelvic pain. She had a couple of beers prior to the pain starting which isn't typical for her. The pain was constant from Sunday to Monday, now just tender to palpation. She had normal BM's until yesterday when she had 2-3 episodes of diarrhea and 2 x so far today.    No bladder c/o.   She will have a left knee replacement next year, prior right knee replacement (did great).   Patient's last menstrual period was 05/12/2014 (exact date).          Sexually active: Yes.    The current method of family planning is post menopausal status.    Exercising: Yes.     Yoga cardio  Smoker:  no  Health Maintenance: Pap:  09-20-15 WNL NEG HR HPV, 08-27-12 WNL NEG HR HPV   History of abnormal Pap:  yes had cryo surgery (not sure when, she thinks it was in the last 10 years but nothing in epic as far back as 2014) MMG:  05/06/21 Bi-rads 1 neg  BMD:   none  Colonoscopy: 12/17/15 due. TDaP:  07/24/11 Gardasil: n/a   reports that she quit smoking about 9 years ago. Her smoking use included cigarettes. She has a 15.00 pack-year smoking history. She has never used smokeless tobacco. She reports current alcohol use of about 8.0 standard drinks of alcohol per week. She reports that she does not use drugs. She has 1-2 glasses of wine every 1-2 days.  Husband died in 29-Jun-2023 of a stroke at 72. 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   Past Medical History:  Diagnosis Date   Anxiety    GERD (gastroesophageal reflux disease)    History of adenomatous polyp of colon    12-17-2015  TUBULAR ADENOMA AND HYPERPLASTIC   History of cardiac murmur    per pt told was mild at 1989 surgery   History of multiple concussions    per pt last one 1992 , approx.,   residual migraines which is along with hereditary migraines   Migraine    PMB (postmenopausal bleeding)    Wears glasses     Past Surgical History:  Procedure Laterality Date   CARPAL TUNNEL RELEASE Right 01/29/2009   COLONOSCOPY  12/17/2015   DILATATION & CURETTAGE/HYSTEROSCOPY WITH MYOSURE N/A 03/26/2017   Procedure: DILATATION & CURETTAGE/HYSTEROSCOPY WITH MYOSURE;  Surgeon: Romualdo Bolk, MD;  Location: [facility];  Service: Gynecology;  Laterality: N/A;   MANDIBLE SURGERY  1989   complete mandible reconstruction for previous injury as younger child--  per pt retained hardware   NASAL SINUS SURGERY  2002   REPLACEMENT TOTAL KNEE Right    REPLACEMENT TOTAL KNEE Right    4/21   ROTATOR CUFF REPAIR Right 1992    Current Outpatient Medications  Medication Sig Dispense Refill   CALCIUM PO Take 600 mg by mouth.     chlorproMAZINE (THORAZINE) 25 MG tablet Take 1-2 tablets by mouth as needed.   1   FLUoxetine (PROZAC) 10 MG capsule Take 1 capsule (10 mg total) by mouth daily. 90 capsule 3   GOODSENSE PAIN RELIEF EXTRA ST 500 MG tablet Take 1,000 mg by mouth every 8 (eight) hours as needed.  Melatonin 5 MG CAPS Take by mouth at bedtime.      Multiple Vitamin (MULTIVITAMIN) capsule Take 1 capsule by mouth daily.     omeprazole (PRILOSEC) 40 MG capsule Take 40 mg by mouth every morning.      Probiotic Product (PHILLIPS COLON HEALTH PO) Take by mouth.     QUDEXY XR 200 MG CS24 Take 2 capsules by mouth every morning.   5   UNABLE TO FIND Take by mouth 1 day or 1 dose. Med Name: estrovin sleep cool     vitamin B-12 (CYANOCOBALAMIN) 100 MCG tablet Take 100 mcg by mouth daily.     zolmitriptan (ZOMIG) 5 MG nasal solution Place into the nose as needed for migraine.     No current facility-administered medications for this visit.    Family History  Problem Relation Age of Onset   Alzheimer's disease Mother    Hyperlipidemia Mother    Thyroid disease Mother     Diabetes Father    Stroke Father    Hashimoto's thyroiditis Sister     Review of Systems  All other systems reviewed and are negative.  Exam:   BP 130/82   Pulse 74   Ht 5\' 2"  (1.575 m)   Wt 145 lb (65.8 kg)   LMP 05/12/2014 (Exact Date)   SpO2 100%   BMI 26.52 kg/m   Weight change: @WEIGHTCHANGE @ Height:   Height: 5\' 2"  (157.5 cm)  Ht Readings from Last 3 Encounters:  05/25/21 5\' 2"  (1.575 m)  05/19/20 5' 2.5" (1.588 m)  05/19/19 5' 2.21" (1.58 m)    General appearance: alert, cooperative and appears stated age Head: Normocephalic, without obvious abnormality, atraumatic Neck: no adenopathy, supple, symmetrical, trachea midline and thyroid normal to inspection and palpation Lungs: clear to auscultation bilaterally Cardiovascular: regular rate and rhythm Breasts: normal appearance, no masses or tenderness Abdomen: soft, non-tender; non distended,  no masses,  no organomegaly Extremities: extremities normal, atraumatic, no cyanosis or edema Skin: Skin color, texture, turgor normal. No rashes or lesions Lymph nodes: Cervical, supraclavicular, and axillary nodes normal. No abnormal inguinal nodes palpated Neurologic: Grossly normal   Pelvic: External genitalia:  no lesions              Urethra:  normal appearing urethra with no masses, tenderness or lesions              Bartholins and Skenes: normal                 Vagina: atrophic appearing vagina with normal color and discharge, no lesions              Cervix: no lesions               Bimanual Exam:  Uterus:  normal size, contour, position, consistency, mobility, non-tender              Adnexa: no mass, fullness, tenderness               Rectovaginal: Confirms               Anus:  normal sphincter tone, no lesions  07/25/21 chaperoned for the exam.  1. Well woman exam Discussed breast self exam Discussed calcium and vit D intake Mammogram UTD Colonoscopy due, she will schedule  2. Screening for cervical  cancer - Cytology - PAP with hpv  3. Laboratory exam ordered as part of routine general medical examination - CBC - Comprehensive metabolic panel - Lipid  panel  4. Mood changes Doing well on prozac - FLUoxetine (PROZAC) 10 MG capsule; Take 1 capsule (10 mg total) by mouth daily.  Dispense: 90 capsule; Refill: 3  5. LLQ abdominal pain Suspect GI2 She will f/u with Dr Loreta Ave  6. Immunization due - Tdap vaccine greater than or equal to 7yo IM

## 2021-05-25 ENCOUNTER — Other Ambulatory Visit (HOSPITAL_COMMUNITY)
Admission: RE | Admit: 2021-05-25 | Discharge: 2021-05-25 | Disposition: A | Discharge status: Home / Self Care | Payer: BC Managed Care – PPO | Source: Ambulatory Visit | Attending: Obstetrics and Gynecology | Admitting: Obstetrics and Gynecology

## 2021-05-25 ENCOUNTER — Encounter: Payer: Self-pay | Admitting: Obstetrics and Gynecology

## 2021-05-25 ENCOUNTER — Other Ambulatory Visit: Payer: Self-pay

## 2021-05-25 ENCOUNTER — Ambulatory Visit (INDEPENDENT_AMBULATORY_CARE_PROVIDER_SITE_OTHER): Payer: BC Managed Care – PPO | Admitting: Obstetrics and Gynecology

## 2021-05-25 VITALS — BP 130/82 | HR 74 | Ht 62.0 in | Wt 145.0 lb

## 2021-05-25 DIAGNOSIS — K219 Gastro-esophageal reflux disease without esophagitis: Secondary | ICD-10-CM | POA: Insufficient documentation

## 2021-05-25 DIAGNOSIS — Z0000 Encounter for general adult medical examination without abnormal findings: Secondary | ICD-10-CM | POA: Diagnosis not present

## 2021-05-25 DIAGNOSIS — Z1211 Encounter for screening for malignant neoplasm of colon: Secondary | ICD-10-CM | POA: Insufficient documentation

## 2021-05-25 DIAGNOSIS — Z23 Encounter for immunization: Secondary | ICD-10-CM

## 2021-05-25 DIAGNOSIS — R4586 Emotional lability: Secondary | ICD-10-CM | POA: Diagnosis not present

## 2021-05-25 DIAGNOSIS — K580 Irritable bowel syndrome with diarrhea: Secondary | ICD-10-CM | POA: Insufficient documentation

## 2021-05-25 DIAGNOSIS — Z124 Encounter for screening for malignant neoplasm of cervix: Secondary | ICD-10-CM | POA: Insufficient documentation

## 2021-05-25 DIAGNOSIS — Z01419 Encounter for gynecological examination (general) (routine) without abnormal findings: Secondary | ICD-10-CM | POA: Diagnosis not present

## 2021-05-25 DIAGNOSIS — R1032 Left lower quadrant pain: Secondary | ICD-10-CM

## 2021-05-25 MED ORDER — FLUOXETINE HCL 10 MG PO CAPS: 10.0000 mg | ORAL_CAPSULE | Freq: Every day | ORAL | 3 refills | Status: DC

## 2021-05-25 NOTE — Patient Instructions (Signed)

## 2021-05-26 LAB — COMPREHENSIVE METABOLIC PANEL
AG Ratio: 1.8 (calc) (ref 1.0–2.5)
ALT: 18 U/L (ref 6–29)
AST: 19 U/L (ref 10–35)
Albumin: 4.2 g/dL (ref 3.6–5.1)
Alkaline phosphatase (APISO): 77 U/L (ref 37–153)
BUN: 9 mg/dL (ref 7–25)
CO2: 24 mmol/L (ref 20–32)
Calcium: 8.6 mg/dL (ref 8.6–10.4)
Chloride: 102 mmol/L (ref 98–110)
Creat: 0.68 mg/dL (ref 0.50–1.03)
Globulin: 2.4 g/dL (calc) (ref 1.9–3.7)
Glucose, Bld: 79 mg/dL (ref 65–99)
Potassium: 4 mmol/L (ref 3.5–5.3)
Sodium: 137 mmol/L (ref 135–146)
Total Bilirubin: 0.3 mg/dL (ref 0.2–1.2)
Total Protein: 6.6 g/dL (ref 6.1–8.1)

## 2021-05-26 LAB — CBC
HCT: 38.9 % (ref 35.0–45.0)
Hemoglobin: 13.1 g/dL (ref 11.7–15.5)
MCH: 30.5 pg (ref 27.0–33.0)
MCHC: 33.7 g/dL (ref 32.0–36.0)
MCV: 90.7 fL (ref 80.0–100.0)
MPV: 9.5 fL (ref 7.5–12.5)
Platelets: 277 10*3/uL (ref 140–400)
RBC: 4.29 10*6/uL (ref 3.80–5.10)
RDW: 12.2 % (ref 11.0–15.0)
WBC: 6.2 10*3/uL (ref 3.8–10.8)

## 2021-05-26 LAB — LIPID PANEL
Cholesterol: 205 mg/dL — ABNORMAL HIGH (ref <200)
HDL: 49 mg/dL — ABNORMAL LOW (ref >=50)
LDL Cholesterol (Calc): 129 mg/dL (calc) — ABNORMAL HIGH
Non-HDL Cholesterol (Calc): 156 mg/dL (calc) — ABNORMAL HIGH (ref <130)
Total CHOL/HDL Ratio: 4.2 (calc) (ref <5.0)
Triglycerides: 155 mg/dL — ABNORMAL HIGH (ref <150)

## 2021-05-30 LAB — CYTOLOGY - PAP
Comment: NEGATIVE
Diagnosis: NEGATIVE
High risk HPV: NEGATIVE

## 2021-06-09 DIAGNOSIS — G43111 Migraine with aura, intractable, with status migrainosus: Secondary | ICD-10-CM | POA: Diagnosis not present

## 2021-06-09 DIAGNOSIS — G43719 Chronic migraine without aura, intractable, without status migrainosus: Secondary | ICD-10-CM | POA: Diagnosis not present

## 2021-08-31 DIAGNOSIS — M1712 Unilateral primary osteoarthritis, left knee: Secondary | ICD-10-CM | POA: Diagnosis not present

## 2021-11-08 DIAGNOSIS — Z13220 Encounter for screening for lipoid disorders: Secondary | ICD-10-CM | POA: Diagnosis not present

## 2021-11-08 DIAGNOSIS — Z1331 Encounter for screening for depression: Secondary | ICD-10-CM | POA: Diagnosis not present

## 2021-11-08 DIAGNOSIS — M1990 Unspecified osteoarthritis, unspecified site: Secondary | ICD-10-CM | POA: Diagnosis not present

## 2021-11-08 DIAGNOSIS — Z6825 Body mass index (BMI) 25.0-25.9, adult: Secondary | ICD-10-CM | POA: Diagnosis not present

## 2021-12-02 DIAGNOSIS — G43111 Migraine with aura, intractable, with status migrainosus: Secondary | ICD-10-CM | POA: Diagnosis not present

## 2021-12-02 DIAGNOSIS — G43719 Chronic migraine without aura, intractable, without status migrainosus: Secondary | ICD-10-CM | POA: Diagnosis not present

## 2022-01-23 ENCOUNTER — Ambulatory Visit: Payer: BC Managed Care – PPO | Admitting: Podiatry

## 2022-01-23 ENCOUNTER — Ambulatory Visit (INDEPENDENT_AMBULATORY_CARE_PROVIDER_SITE_OTHER): Payer: BC Managed Care – PPO

## 2022-01-23 DIAGNOSIS — M1712 Unilateral primary osteoarthritis, left knee: Secondary | ICD-10-CM | POA: Diagnosis not present

## 2022-01-23 DIAGNOSIS — Z01812 Encounter for preprocedural laboratory examination: Secondary | ICD-10-CM | POA: Diagnosis not present

## 2022-01-23 DIAGNOSIS — M25562 Pain in left knee: Secondary | ICD-10-CM | POA: Diagnosis not present

## 2022-01-23 DIAGNOSIS — M2011 Hallux valgus (acquired), right foot: Secondary | ICD-10-CM | POA: Diagnosis not present

## 2022-01-23 DIAGNOSIS — M2012 Hallux valgus (acquired), left foot: Secondary | ICD-10-CM | POA: Diagnosis not present

## 2022-01-23 DIAGNOSIS — M2041 Other hammer toe(s) (acquired), right foot: Secondary | ICD-10-CM

## 2022-01-23 NOTE — Progress Notes (Signed)
? ?Subjective: 56 y.o. female presenting today as a new patient for evaluation of a bunion and hammertoe to the right foot.  Patient currently does not have any pain associated to the deformity.  She says the bunion and hammertoes have been present for several years.  She did wear a pair of tight fitting shoes which irritated her foot few weeks ago but she no longer has any pain or tenderness to the area.  She presents just to have it evaluated ? ?Past Medical History:  ?Diagnosis Date  ? Anxiety   ? GERD (gastroesophageal reflux disease)   ? History of adenomatous polyp of colon   ? 12-17-2015  TUBULAR ADENOMA AND HYPERPLASTIC  ? History of cardiac murmur   ? per pt told was mild at 1989 surgery  ? History of multiple concussions   ? per pt last one 1992 , approx.,  residual migraines which is along with hereditary migraines  ? Migraine   ? PMB (postmenopausal bleeding)   ? Wears glasses   ? ?Past Surgical History:  ?Procedure Laterality Date  ? CARPAL TUNNEL RELEASE Right 01/29/2009  ? COLONOSCOPY  12/17/2015  ? DILATATION & CURETTAGE/HYSTEROSCOPY WITH MYOSURE N/A 03/26/2017  ? Procedure: DILATATION & CURETTAGE/HYSTEROSCOPY WITH MYOSURE;  Surgeon: Salvadore Dom, MD;  Location[facility];  Service: Gynecology;  Laterality: N/A;  ? [place]  ? complete mandible reconstruction for previous injury as younger child--  per pt retained hardware  ? NASAL SINUS SURGERY  2002  ? REPLACEMENT TOTAL KNEE Right   ? REPLACEMENT TOTAL KNEE Right   ? 4/21  ? ROTATOR CUFF REPAIR Right 1992  ? ?Allergies  ?Allergen Reactions  ? Oxycodone Hives  ? ?Objective: ?Physical Exam ?General: The patient is alert and oriented x3 in no acute distress. ? ?Dermatology: Skin is cool, dry and supple bilateral lower extremities. Negative for open lesions or macerations. ? ?Vascular: Palpable pedal pulses bilaterally. No edema or erythema noted. Capillary refill within normal limits. ? ?Neurological: Epicritic  and protective threshold grossly intact bilaterally.  ? ?Musculoskeletal Exam: Clinical evidence of bunion deformity noted to the respective foot. There is moderate pain on palpation range of motion of the first MPJ. Lateral deviation of the hallux noted consistent with hallux abductovalgus. ?Hammertoe contracture also noted on clinical exam to digits 2 of the right foot. Symptomatic pain on palpation and range of motion also noted to the metatarsal phalangeal joints of the respective hammertoe digits.   ? ?Radiographic Exam: Increased intermetatarsal angle greater than 15? with a hallux abductus angle greater than 30? noted on AP view. Moderate degenerative changes noted within the first MPJ. Contracture deformity also noted to the interphalangeal joints and MPJs of the digits of the respective hammertoes. ? ? ? ?Assessment: ?1. HAV w/ bunion deformity right ?2. Hammertoe deformity second digit right ? ? ?Plan of Care:  ?1. Patient was evaluated. X-Rays reviewed. ?2.  Recommend conservative treatment for now.  Recommend good supportive shoes and sneakers that are wide and do not constrict the toebox area ?3.  Recommend arch supports ?4.  Advised against going barefoot ?5.  Return to clinic as needed ? ? ?Edrick Kins, DPM ?[facility] ? ?Dr. Edrick Kins, DPM  ?  ?[address]                                       ?  [place], [place] [address]                ?Office [phone]  ?Fax [phone] ? ? ? ? ? ?

## 2022-02-04 IMAGING — MG MM DIGITAL SCREENING BILAT W/ TOMO AND CAD
8 series · 8 of 24 positions shown · non-contrast
Comparison: Previous exam(s).

CLINICAL DATA: Screening.

EXAM:
DIGITAL SCREENING BILATERAL MAMMOGRAM WITH TOMOSYNTHESIS AND CAD
TECHNIQUE: Bilateral screening digital craniocaudal and mediolateral oblique
mammograms were obtained. Bilateral screening digital breast
tomosynthesis was performed. The images were evaluated with
computer-aided detection.

[L MLO synth-2D]
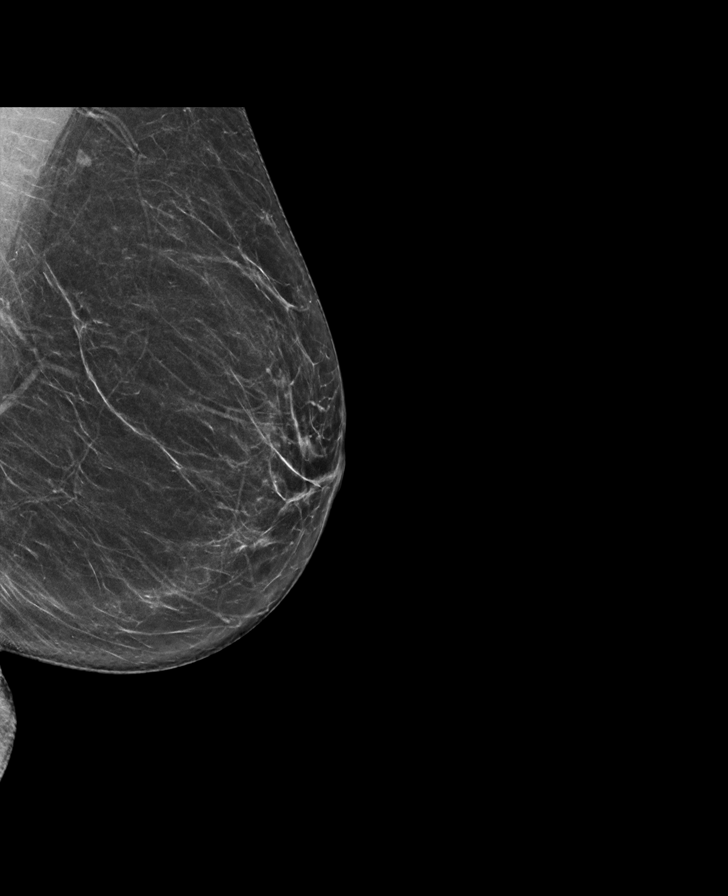

[R CC synth-2D]
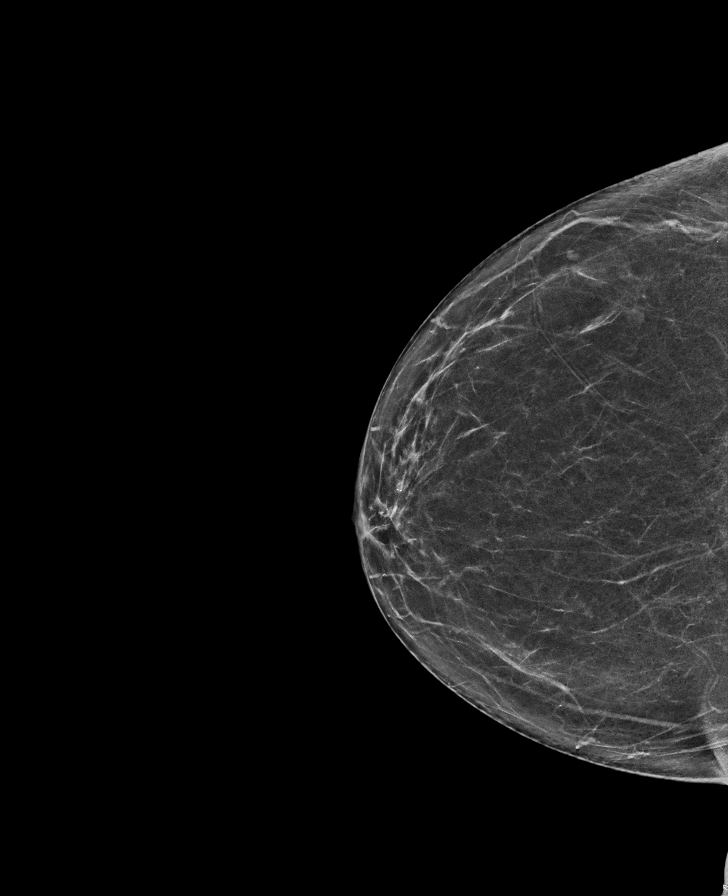

[R MLO synth-2D]
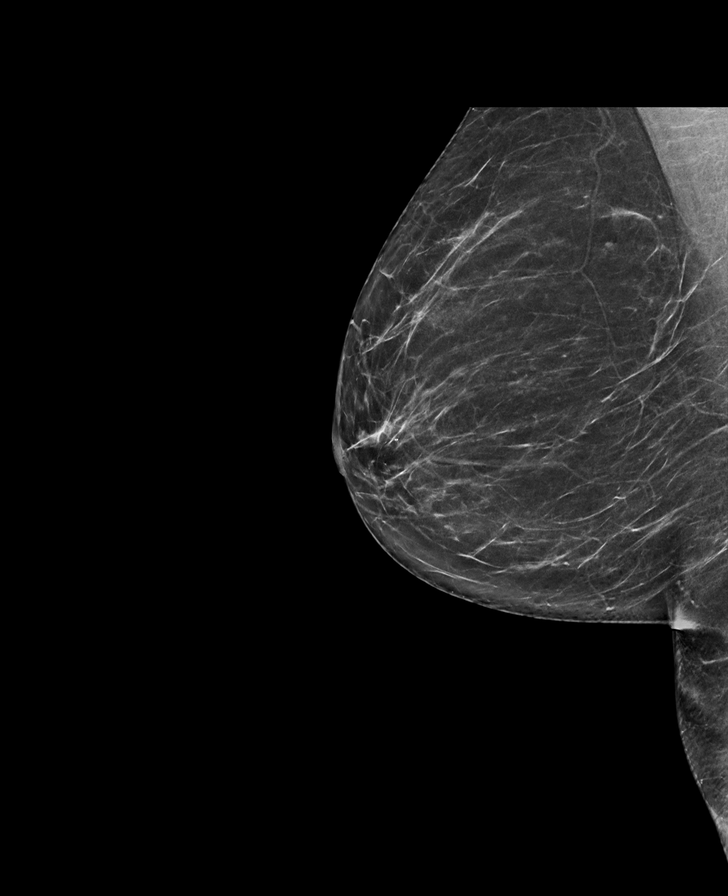

[L CC synth-2D]
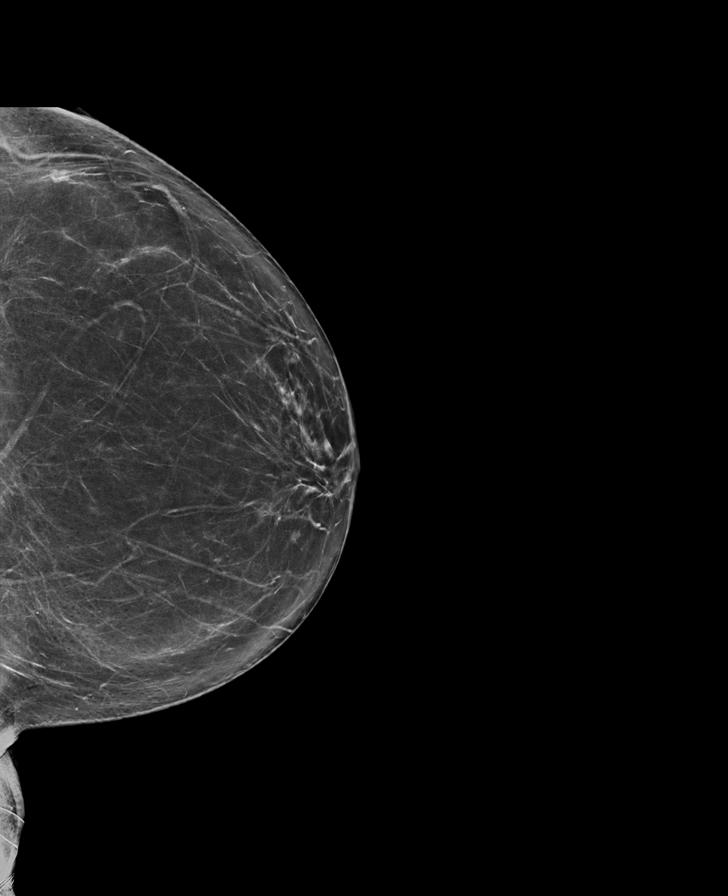

[L MLO tomo · tomo slice 34/67.0]
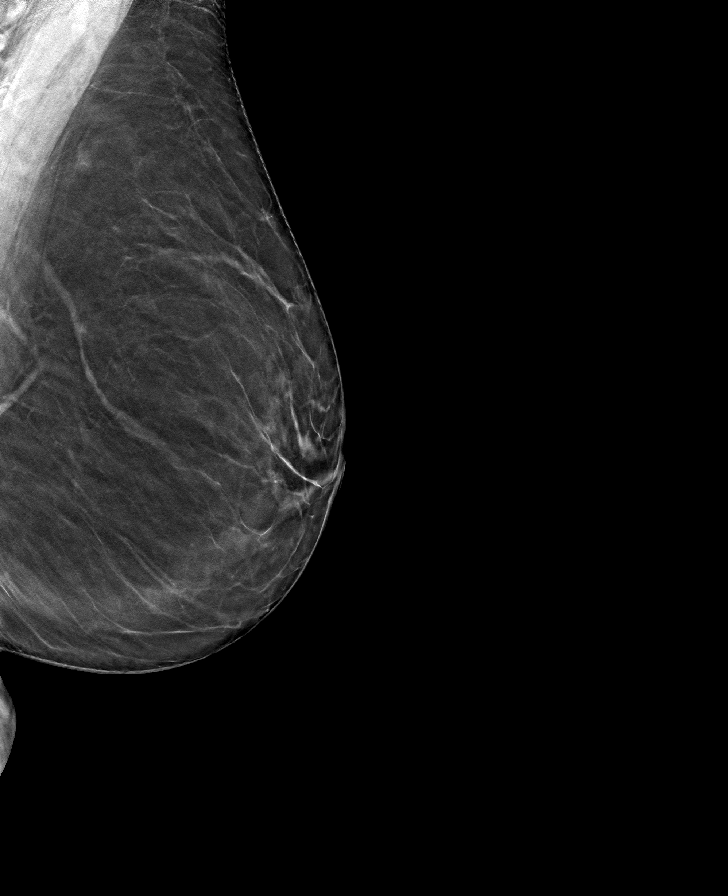

[R MLO tomo · tomo slice 33/65.0]
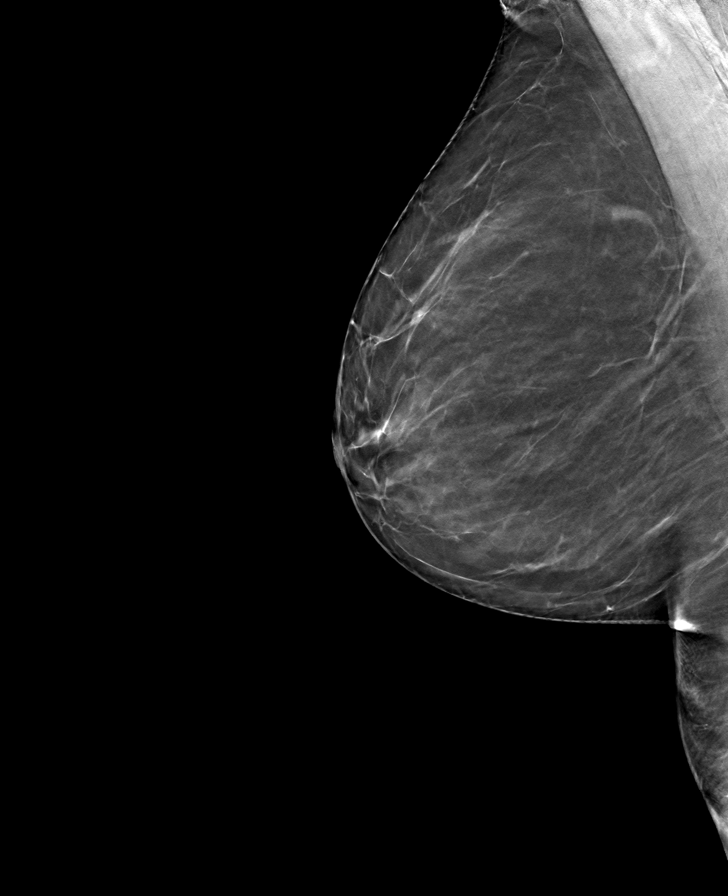

[L CC tomo · tomo slice 36/71.0]
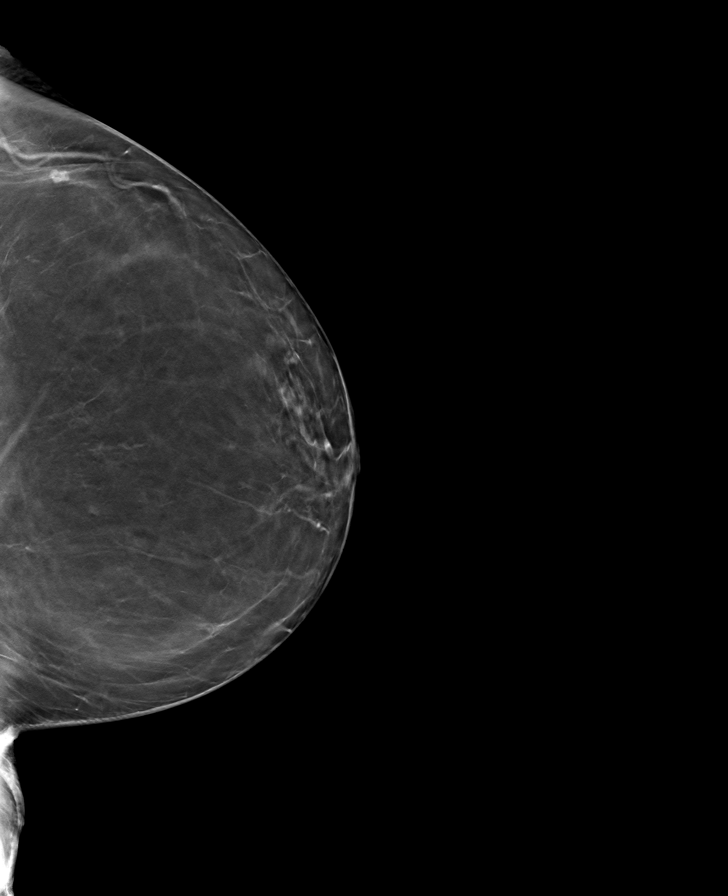

[R CC tomo · tomo slice 32/63.0]
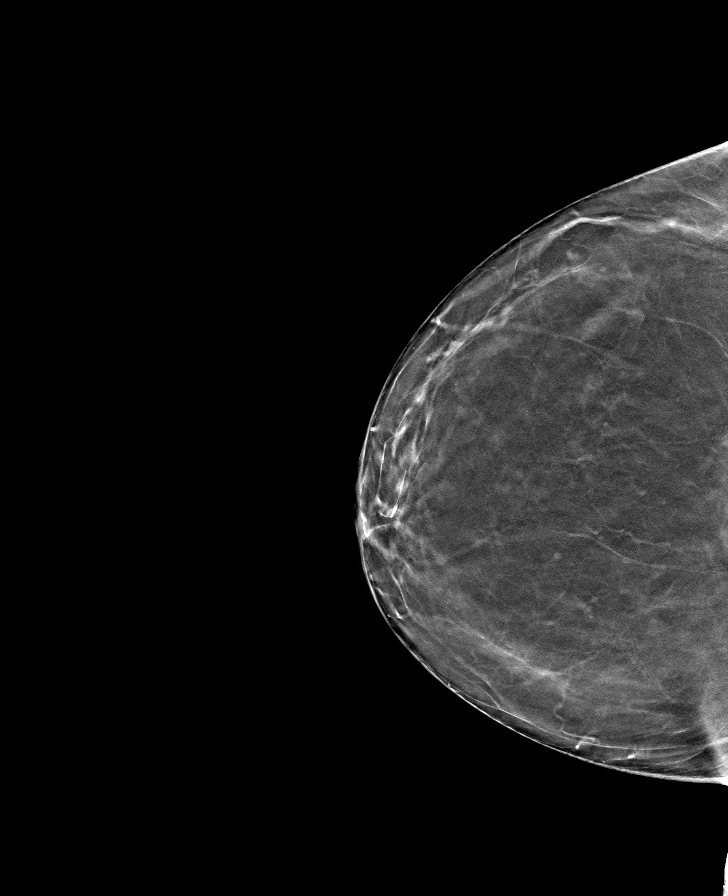

[8 of 24 positions shown; findings below may reference images not displayed]

ACR Breast Density Category b: There are scattered areas of
fibroglandular density.
FINDINGS: There are no findings suspicious for malignancy.
IMPRESSION: No mammographic evidence of malignancy. A result letter of this
screening mammogram will be mailed directly to the patient.

RECOMMENDATION:
Screening mammogram in one year. (Code:51-O-LD2)

BI-RADS CATEGORY  1: Negative.

## 2022-02-09 DIAGNOSIS — M1712 Unilateral primary osteoarthritis, left knee: Secondary | ICD-10-CM | POA: Diagnosis not present

## 2022-02-09 DIAGNOSIS — G8918 Other acute postprocedural pain: Secondary | ICD-10-CM | POA: Diagnosis not present

## 2022-02-22 DIAGNOSIS — M1712 Unilateral primary osteoarthritis, left knee: Secondary | ICD-10-CM | POA: Diagnosis not present

## 2022-03-24 DIAGNOSIS — M1712 Unilateral primary osteoarthritis, left knee: Secondary | ICD-10-CM | POA: Diagnosis not present

## 2022-05-24 ENCOUNTER — Other Ambulatory Visit: Payer: Self-pay | Admitting: Obstetrics and Gynecology

## 2022-05-24 DIAGNOSIS — Z1231 Encounter for screening mammogram for malignant neoplasm of breast: Secondary | ICD-10-CM

## 2022-05-24 NOTE — Progress Notes (Signed)
56 y.o. G29P0020 Widowed White or Caucasian Not Hispanic or Latino female here for annual exam. No vaginal bleeding. Lives with her partner, going well, no dyspareunia.   She has a cyst on her vulva, she noticed it April, no change, no pain.   Had left knee replacement in 4/23 (prior right knee replacement). Doing well.     Patient's last menstrual period was 05/12/2014 (exact date).          Sexually active: Yes.    The current method of family planning is post menopausal status.    Exercising: Yes.     Weights Cardio swimming HIIT  Smoker:  no  Health Maintenance: Pap:  05/25/21 WNL Hr HPV Neg  09-20-15 WNL NEG HR HPV    History of abnormal Pap:  yes had cryo surgery (not sure when, she thinks it was in the last 10 years but nothing in epic as far back as 2014) MMG:  05/06/21 Bi-rads neg, has appointment next week.   BMD:   none Colonoscopy:12/17/15 f/u in 5 years recommended, she called Dr Loreta Ave and was told now recommended in 7 years.  TDaP:  05/25/21 Gardasil: n/a   reports that she quit smoking about 10 years ago. Her smoking use included cigarettes. She has a 15.00 pack-year smoking history. She has never used smokeless tobacco. She reports current alcohol use of about 8.0 standard drinks of alcohol per week. She reports that she does not use drugs.  Past Medical History:  Diagnosis Date   Anxiety    GERD (gastroesophageal reflux disease)    History of adenomatous polyp of colon    12-17-2015  TUBULAR ADENOMA AND HYPERPLASTIC   History of cardiac murmur    per pt told was mild at 1989 surgery   History of multiple concussions    per pt last one 1992 , approx.,  residual migraines which is along with hereditary migraines   Migraine    PMB (postmenopausal bleeding)    Wears glasses     Past Surgical History:  Procedure Laterality Date   CARPAL TUNNEL RELEASE Right 01/29/2009   COLONOSCOPY  12/17/2015   DILATATION & CURETTAGE/HYSTEROSCOPY WITH MYOSURE N/A 03/26/2017    Procedure: DILATATION & CURETTAGE/HYSTEROSCOPY WITH MYOSURE;  Surgeon: Romualdo Bolk, MD;  Location: [facility];  Service: Gynecology;  Laterality: N/A;   MANDIBLE SURGERY  1989   complete mandible reconstruction for previous injury as younger child--  per pt retained hardware   NASAL SINUS SURGERY  2002   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     chlorproMAZINE (THORAZINE) 25 MG tablet Take 1-2 tablets by mouth as needed.   1   co-enzyme Q-10 30 MG capsule Take 30 mg by mouth 3 (three) times daily.     FLUoxetine (PROZAC) 10 MG capsule Take 1 capsule (10 mg total) by mouth daily. 90 capsule 3   GOODSENSE PAIN RELIEF EXTRA ST 500 MG tablet Take 1,000 mg by mouth every 8 (eight) hours as needed.     Melatonin 5 MG CAPS Take by mouth at bedtime.      Multiple Vitamin (MULTIVITAMIN) capsule Take 1 capsule by mouth daily.     omeprazole (PRILOSEC) 40 MG capsule Take 40 mg by mouth every morning.  Probiotic Product (PHILLIPS COLON HEALTH PO) Take by mouth.     QUDEXY XR 200 MG CS24 Take 2 capsules by mouth every morning.   5   Riboflavin (B2 PO) Take by mouth.     UNABLE TO FIND Take by mouth 1 day or 1 dose. Med Name: estrovin sleep cool     vitamin B-12 (CYANOCOBALAMIN) 100 MCG tablet Take 100 mcg by mouth daily.     zolmitriptan (ZOMIG) 5 MG nasal solution Place into the nose as needed for migraine.     No current facility-administered medications for this visit.    Family History  Problem Relation Age of Onset   Alzheimer's disease Mother    Hyperlipidemia Mother    Thyroid disease Mother    Diabetes Father    Stroke Father    Hashimoto's thyroiditis Sister     Review of Systems  All other systems reviewed and are negative.   Exam:   BP 138/84   Pulse 77   Ht 5' 2.75"  (1.594 m)   Wt 142 lb (64.4 kg)   LMP 05/12/2014 (Exact Date)   SpO2 100%   BMI 25.36 kg/m   Weight change: @WEIGHTCHANGE @ Height:   Height: 5' 2.75" (159.4 cm)  Ht Readings from Last 3 Encounters:  05/31/22 5' 2.75" (1.594 m)  05/25/21 5\' 2"  (1.575 m)  05/19/20 5' 2.5" (1.588 m)    General appearance: alert, cooperative and appears stated age Head: Normocephalic, without obvious abnormality, atraumatic Neck: no adenopathy, supple, symmetrical, trachea midline and thyroid normal to inspection and palpation Lungs: clear to auscultation bilaterally Cardiovascular: regular rate and rhythm Breasts: normal appearance, no masses or tenderness Abdomen: soft, non-tender; non distended,  no masses,  no organomegaly Extremities: extremities normal, atraumatic, no cyanosis or edema Skin: Skin color, texture, turgor normal. No rashes or lesions Lymph nodes: Cervical, supraclavicular, and axillary nodes normal. No abnormal inguinal nodes palpated Neurologic: Grossly normal   Pelvic: External genitalia:  small epidermoid cyst on the inner right labia majora              Urethra:  normal appearing urethra with no masses, tenderness or lesions              Bartholins and Skenes: normal                 Vagina: normal appearing vagina with normal color and discharge, no lesions              Cervix: no lesions               Bimanual Exam:  Uterus:  normal size, contour, position, consistency, mobility, non-tender              Adnexa: no mass, fullness, tenderness               Rectovaginal: Confirms               Anus:  normal sphincter tone, no lesions    1. Well woman exam Discussed breast self exam Discussed calcium and vit D intake Labs with primary Colonoscopy UTD Mammogram next week Pap is UTD  2. Mood changes Controlled on prozac. Her Neurologist is okay with her being on the throazine and prozac - FLUoxetine (PROZAC) 10 MG capsule; Take 1 capsule (10 mg total) by mouth daily.   Dispense: 90 capsule; Refill: 3

## 2022-05-31 ENCOUNTER — Encounter: Payer: Self-pay | Admitting: Obstetrics and Gynecology

## 2022-05-31 ENCOUNTER — Ambulatory Visit (INDEPENDENT_AMBULATORY_CARE_PROVIDER_SITE_OTHER): Payer: BC Managed Care – PPO | Admitting: Obstetrics and Gynecology

## 2022-05-31 VITALS — BP 138/84 | HR 77 | Ht 62.75 in | Wt 142.0 lb

## 2022-05-31 DIAGNOSIS — R4586 Emotional lability: Secondary | ICD-10-CM | POA: Diagnosis not present

## 2022-05-31 DIAGNOSIS — G43111 Migraine with aura, intractable, with status migrainosus: Secondary | ICD-10-CM | POA: Diagnosis not present

## 2022-05-31 DIAGNOSIS — Z01419 Encounter for gynecological examination (general) (routine) without abnormal findings: Secondary | ICD-10-CM | POA: Diagnosis not present

## 2022-05-31 MED ORDER — FLUOXETINE HCL 10 MG PO CAPS: 10.0000 mg | ORAL_CAPSULE | Freq: Every day | ORAL | 3 refills | Status: AC

## 2022-05-31 NOTE — Patient Instructions (Signed)

## 2022-06-07 ENCOUNTER — Ambulatory Visit: Payer: BC Managed Care – PPO

## 2022-07-03 DIAGNOSIS — E7800 Pure hypercholesterolemia, unspecified: Secondary | ICD-10-CM | POA: Diagnosis not present

## 2022-07-03 DIAGNOSIS — Z6825 Body mass index (BMI) 25.0-25.9, adult: Secondary | ICD-10-CM | POA: Diagnosis not present

## 2022-07-03 DIAGNOSIS — R600 Localized edema: Secondary | ICD-10-CM | POA: Diagnosis not present

## 2022-07-04 ENCOUNTER — Ambulatory Visit
Admission: RE | Admit: 2022-07-04 | Discharge: 2022-07-04 | Disposition: A | Discharge status: Home / Self Care | Payer: BC Managed Care – PPO | Source: Ambulatory Visit | Attending: Obstetrics and Gynecology | Admitting: Obstetrics and Gynecology

## 2022-07-04 DIAGNOSIS — Z1231 Encounter for screening mammogram for malignant neoplasm of breast: Secondary | ICD-10-CM

## 2022-08-28 ENCOUNTER — Telehealth: Payer: Self-pay

## 2022-08-28 NOTE — Patient Outreach (Signed)
  Care Coordination   08/28/2022 Name: Amy Barr MRN: [id number] DOB: 12/17/1965   Care Coordination Outreach Attempts:  An unsuccessful telephone outreach was attempted today to offer the patient information about available care coordination services as a benefit of their health plan.   Follow Up Plan:  Additional outreach attempts will be made to offer the patient care coordination information and services.   Encounter Outcome:  No Answer  Care Coordination Interventions Activated:  No   Care Coordination Interventions:  No, not indicated    Tomasa Rand, RN, BSN, CEN [facility] [employer] [phone]

## 2022-09-20 ENCOUNTER — Telehealth: Payer: Self-pay

## 2022-09-20 NOTE — Patient Outreach (Signed)
  Care Coordination   09/20/2022 Name: Amy Barr MRN: [id number] DOB: 1966-07-30   Care Coordination Outreach Attempts:  A second unsuccessful outreach was attempted today to offer the patient with information about available care coordination services as a benefit of their health plan.     Follow Up Plan:  Additional outreach attempts will be made to offer the patient care coordination information and services.   Encounter Outcome:  No Answer   Care Coordination Interventions:  No, not indicated    Rowe Pavy, RN, BSN, [facility] [employer] [phone]

## 2022-09-21 ENCOUNTER — Telehealth: Payer: Self-pay

## 2022-09-21 NOTE — Patient Outreach (Signed)
  Care Coordination   09/21/2022 Name: Amy Barr MRN: [id number] DOB: February 15, 1966   Care Coordination Outreach Attempts:  A third unsuccessful outreach was attempted today to offer the patient with information about available care coordination services as a benefit of their health plan.   Follow Up Plan:  No further outreach attempts will be made at this time. We have been unable to contact the patient to offer or enroll patient in care coordination services  Encounter Outcome:  No Answer   Care Coordination Interventions:  No, not indicated    Rowe Pavy, RN, BSN, CEN [facility] Coordinator [phone]

## 2022-11-28 DIAGNOSIS — G43719 Chronic migraine without aura, intractable, without status migrainosus: Secondary | ICD-10-CM | POA: Diagnosis not present

## 2022-11-28 DIAGNOSIS — Z1211 Encounter for screening for malignant neoplasm of colon: Secondary | ICD-10-CM | POA: Diagnosis not present

## 2022-11-28 DIAGNOSIS — R1310 Dysphagia, unspecified: Secondary | ICD-10-CM | POA: Diagnosis not present

## 2022-11-28 DIAGNOSIS — K219 Gastro-esophageal reflux disease without esophagitis: Secondary | ICD-10-CM | POA: Diagnosis not present

## 2022-12-08 DIAGNOSIS — K5520 Angiodysplasia of colon without hemorrhage: Secondary | ICD-10-CM | POA: Diagnosis not present

## 2022-12-08 DIAGNOSIS — K219 Gastro-esophageal reflux disease without esophagitis: Secondary | ICD-10-CM | POA: Diagnosis not present

## 2022-12-08 DIAGNOSIS — K319 Disease of stomach and duodenum, unspecified: Secondary | ICD-10-CM | POA: Diagnosis not present

## 2022-12-08 DIAGNOSIS — K2289 Other specified disease of esophagus: Secondary | ICD-10-CM | POA: Diagnosis not present

## 2022-12-08 DIAGNOSIS — R1310 Dysphagia, unspecified: Secondary | ICD-10-CM | POA: Diagnosis not present

## 2022-12-08 DIAGNOSIS — K5730 Diverticulosis of large intestine without perforation or abscess without bleeding: Secondary | ICD-10-CM | POA: Diagnosis not present

## 2022-12-08 DIAGNOSIS — Z1211 Encounter for screening for malignant neoplasm of colon: Secondary | ICD-10-CM | POA: Diagnosis not present

## 2022-12-08 DIAGNOSIS — K2970 Gastritis, unspecified, without bleeding: Secondary | ICD-10-CM | POA: Diagnosis not present

## 2022-12-08 DIAGNOSIS — K317 Polyp of stomach and duodenum: Secondary | ICD-10-CM | POA: Diagnosis not present

## 2022-12-08 DIAGNOSIS — K648 Other hemorrhoids: Secondary | ICD-10-CM | POA: Diagnosis not present

## 2022-12-28 DIAGNOSIS — K5730 Diverticulosis of large intestine without perforation or abscess without bleeding: Secondary | ICD-10-CM | POA: Diagnosis not present

## 2022-12-28 DIAGNOSIS — Z86010 Personal history of colonic polyps: Secondary | ICD-10-CM | POA: Diagnosis not present

## 2022-12-28 DIAGNOSIS — K219 Gastro-esophageal reflux disease without esophagitis: Secondary | ICD-10-CM | POA: Diagnosis not present

## 2022-12-28 DIAGNOSIS — R194 Change in bowel habit: Secondary | ICD-10-CM | POA: Diagnosis not present

## 2023-01-08 ENCOUNTER — Telehealth: Payer: Self-pay

## 2023-01-08 NOTE — Patient Outreach (Signed)
  Care Coordination   01/08/2023 Name: Amy Barr MRN: [id number] DOB: 1966/03/12   Care Coordination Outreach Attempts:  An unsuccessful telephone outreach was attempted today to offer the patient information about available care coordination services as a benefit of their health plan.   Follow Up Plan:  Additional outreach attempts will be made to offer the patient care coordination information and services.   Encounter Outcome:  No Answer   Care Coordination Interventions:  No, not indicated    Tomasa Rand, RN, BSN, [facility] [employer] [phone]

## 2023-01-10 ENCOUNTER — Telehealth: Payer: Self-pay

## 2023-01-10 NOTE — Patient Outreach (Signed)
  Care Coordination   01/10/2023 Name: Amy Barr MRN: [id number] DOB: 1966-04-23   Care Coordination Outreach Attempts:  A second unsuccessful outreach was attempted today to offer the patient with information about available care coordination services as a benefit of their health plan.     Follow Up Plan:  Additional outreach attempts will be made to offer the patient care coordination information and services.   Encounter Outcome:  No Answer   Care Coordination Interventions:  No, not indicated    Tomasa Rand, RN, BSN, [facility] [employer] [phone]

## 2023-01-12 ENCOUNTER — Telehealth: Payer: Self-pay

## 2023-01-12 NOTE — Patient Outreach (Signed)
  Care Coordination   01/12/2023 Name: Amy Barr MRN: [id number] DOB: Jul 12, 1966   Care Coordination Outreach Attempts:  A third unsuccessful outreach was attempted today to offer the patient with information about available care coordination services as a benefit of their health plan.   Follow Up Plan:  No further outreach attempts will be made at this time. We have been unable to contact the patient to offer or enroll patient in care coordination services  Encounter Outcome:  No Answer   Care Coordination Interventions:  No, not indicated    Tomasa Rand, RN, BSN, CEN [place] Coordinator [phone]

## 2023-03-13 ENCOUNTER — Other Ambulatory Visit: Payer: Self-pay | Admitting: Obstetrics and Gynecology

## 2023-03-13 DIAGNOSIS — R4586 Emotional lability: Secondary | ICD-10-CM

## 2023-05-24 DIAGNOSIS — G43719 Chronic migraine without aura, intractable, without status migrainosus: Secondary | ICD-10-CM | POA: Diagnosis not present

## 2023-05-28 DIAGNOSIS — M542 Cervicalgia: Secondary | ICD-10-CM | POA: Diagnosis not present

## 2023-05-28 DIAGNOSIS — M25512 Pain in left shoulder: Secondary | ICD-10-CM | POA: Diagnosis not present

## 2023-05-31 DIAGNOSIS — M542 Cervicalgia: Secondary | ICD-10-CM | POA: Diagnosis not present

## 2023-05-31 DIAGNOSIS — M25512 Pain in left shoulder: Secondary | ICD-10-CM | POA: Diagnosis not present

## 2023-05-31 DIAGNOSIS — M25612 Stiffness of left shoulder, not elsewhere classified: Secondary | ICD-10-CM | POA: Diagnosis not present

## 2023-06-05 DIAGNOSIS — M25612 Stiffness of left shoulder, not elsewhere classified: Secondary | ICD-10-CM | POA: Diagnosis not present

## 2023-06-05 DIAGNOSIS — M25512 Pain in left shoulder: Secondary | ICD-10-CM | POA: Diagnosis not present

## 2023-06-05 DIAGNOSIS — M542 Cervicalgia: Secondary | ICD-10-CM | POA: Diagnosis not present

## 2023-06-08 DIAGNOSIS — M542 Cervicalgia: Secondary | ICD-10-CM | POA: Diagnosis not present

## 2023-06-08 DIAGNOSIS — M25612 Stiffness of left shoulder, not elsewhere classified: Secondary | ICD-10-CM | POA: Diagnosis not present

## 2023-06-08 DIAGNOSIS — M25512 Pain in left shoulder: Secondary | ICD-10-CM | POA: Diagnosis not present

## 2023-06-12 DIAGNOSIS — M25512 Pain in left shoulder: Secondary | ICD-10-CM | POA: Diagnosis not present

## 2023-06-12 DIAGNOSIS — M25612 Stiffness of left shoulder, not elsewhere classified: Secondary | ICD-10-CM | POA: Diagnosis not present

## 2023-06-12 DIAGNOSIS — M542 Cervicalgia: Secondary | ICD-10-CM | POA: Diagnosis not present

## 2023-06-19 DIAGNOSIS — M25612 Stiffness of left shoulder, not elsewhere classified: Secondary | ICD-10-CM | POA: Diagnosis not present

## 2023-06-19 DIAGNOSIS — M542 Cervicalgia: Secondary | ICD-10-CM | POA: Diagnosis not present

## 2023-06-19 DIAGNOSIS — M25512 Pain in left shoulder: Secondary | ICD-10-CM | POA: Diagnosis not present

## 2023-06-22 DIAGNOSIS — M25512 Pain in left shoulder: Secondary | ICD-10-CM | POA: Diagnosis not present

## 2023-06-22 DIAGNOSIS — M25612 Stiffness of left shoulder, not elsewhere classified: Secondary | ICD-10-CM | POA: Diagnosis not present

## 2023-06-22 DIAGNOSIS — M542 Cervicalgia: Secondary | ICD-10-CM | POA: Diagnosis not present

## 2023-06-26 DIAGNOSIS — M25512 Pain in left shoulder: Secondary | ICD-10-CM | POA: Diagnosis not present

## 2023-06-26 DIAGNOSIS — M542 Cervicalgia: Secondary | ICD-10-CM | POA: Diagnosis not present

## 2023-06-26 DIAGNOSIS — M25612 Stiffness of left shoulder, not elsewhere classified: Secondary | ICD-10-CM | POA: Diagnosis not present

## 2023-07-03 DIAGNOSIS — M25512 Pain in left shoulder: Secondary | ICD-10-CM | POA: Diagnosis not present

## 2023-07-03 DIAGNOSIS — M25612 Stiffness of left shoulder, not elsewhere classified: Secondary | ICD-10-CM | POA: Diagnosis not present

## 2023-07-03 DIAGNOSIS — M542 Cervicalgia: Secondary | ICD-10-CM | POA: Diagnosis not present

## 2023-07-06 DIAGNOSIS — M542 Cervicalgia: Secondary | ICD-10-CM | POA: Diagnosis not present

## 2023-07-06 DIAGNOSIS — M25612 Stiffness of left shoulder, not elsewhere classified: Secondary | ICD-10-CM | POA: Diagnosis not present

## 2023-07-06 DIAGNOSIS — M25512 Pain in left shoulder: Secondary | ICD-10-CM | POA: Diagnosis not present

## 2023-07-10 DIAGNOSIS — M542 Cervicalgia: Secondary | ICD-10-CM | POA: Diagnosis not present

## 2023-07-10 DIAGNOSIS — M25612 Stiffness of left shoulder, not elsewhere classified: Secondary | ICD-10-CM | POA: Diagnosis not present

## 2023-07-10 DIAGNOSIS — M25512 Pain in left shoulder: Secondary | ICD-10-CM | POA: Diagnosis not present

## 2023-07-11 DIAGNOSIS — M19012 Primary osteoarthritis, left shoulder: Secondary | ICD-10-CM | POA: Diagnosis not present

## 2023-07-13 DIAGNOSIS — M25512 Pain in left shoulder: Secondary | ICD-10-CM | POA: Diagnosis not present

## 2023-07-13 DIAGNOSIS — M542 Cervicalgia: Secondary | ICD-10-CM | POA: Diagnosis not present

## 2023-07-13 DIAGNOSIS — M25612 Stiffness of left shoulder, not elsewhere classified: Secondary | ICD-10-CM | POA: Diagnosis not present

## 2023-07-17 DIAGNOSIS — M25612 Stiffness of left shoulder, not elsewhere classified: Secondary | ICD-10-CM | POA: Diagnosis not present

## 2023-07-17 DIAGNOSIS — M542 Cervicalgia: Secondary | ICD-10-CM | POA: Diagnosis not present

## 2023-07-17 DIAGNOSIS — M25512 Pain in left shoulder: Secondary | ICD-10-CM | POA: Diagnosis not present

## 2023-07-25 DIAGNOSIS — M25612 Stiffness of left shoulder, not elsewhere classified: Secondary | ICD-10-CM | POA: Diagnosis not present

## 2023-07-25 DIAGNOSIS — M542 Cervicalgia: Secondary | ICD-10-CM | POA: Diagnosis not present

## 2023-07-25 DIAGNOSIS — M25512 Pain in left shoulder: Secondary | ICD-10-CM | POA: Diagnosis not present

## 2023-07-27 DIAGNOSIS — M542 Cervicalgia: Secondary | ICD-10-CM | POA: Diagnosis not present

## 2023-07-27 DIAGNOSIS — M25612 Stiffness of left shoulder, not elsewhere classified: Secondary | ICD-10-CM | POA: Diagnosis not present

## 2023-07-27 DIAGNOSIS — M25512 Pain in left shoulder: Secondary | ICD-10-CM | POA: Diagnosis not present

## 2023-07-31 DIAGNOSIS — M25512 Pain in left shoulder: Secondary | ICD-10-CM | POA: Diagnosis not present

## 2023-07-31 DIAGNOSIS — M25612 Stiffness of left shoulder, not elsewhere classified: Secondary | ICD-10-CM | POA: Diagnosis not present

## 2023-07-31 DIAGNOSIS — M542 Cervicalgia: Secondary | ICD-10-CM | POA: Diagnosis not present

## 2023-09-04 DIAGNOSIS — N952 Postmenopausal atrophic vaginitis: Secondary | ICD-10-CM | POA: Diagnosis not present

## 2023-09-04 DIAGNOSIS — N958 Other specified menopausal and perimenopausal disorders: Secondary | ICD-10-CM | POA: Diagnosis not present

## 2023-09-04 DIAGNOSIS — Z01411 Encounter for gynecological examination (general) (routine) with abnormal findings: Secondary | ICD-10-CM | POA: Diagnosis not present

## 2023-09-04 DIAGNOSIS — L819 Disorder of pigmentation, unspecified: Secondary | ICD-10-CM | POA: Diagnosis not present

## 2023-09-04 DIAGNOSIS — Z0000 Encounter for general adult medical examination without abnormal findings: Secondary | ICD-10-CM | POA: Diagnosis not present

## 2023-09-04 DIAGNOSIS — Z1231 Encounter for screening mammogram for malignant neoplasm of breast: Secondary | ICD-10-CM | POA: Diagnosis not present

## 2024-04-18 ENCOUNTER — Other Ambulatory Visit: Payer: Self-pay | Admitting: Orthopedic Surgery

## 2024-04-18 DIAGNOSIS — G8929 Other chronic pain: Secondary | ICD-10-CM

## 2024-05-01 ENCOUNTER — Ambulatory Visit
Admission: RE | Admit: 2024-05-01 | Discharge: 2024-05-01 | Disposition: A | Discharge status: Home / Self Care | Source: Ambulatory Visit | Attending: Orthopedic Surgery | Admitting: Orthopedic Surgery

## 2024-05-01 DIAGNOSIS — G8929 Other chronic pain: Secondary | ICD-10-CM

## 2024-07-25 NOTE — Progress Notes (Addendum)
 Anesthesia Review:  PCP: [place] [facility] in [place]  Cardiologist : none   PPM/ ICD: Device Orders: Rep Notified:  Chest x-ray : EKG : Echo : Stress test: Cardiac Cath :   Activity level:  can do a flight of stairs without difficulty  Sleep Study/ CPAP : none  Fasting Blood Sugar :      / Checks Blood Sugar -- times a day:    Blood Thinner/ Instructions /Last Dose: ASA / Instructions/ Last Dose :    Pt was 15 minutes late for preop appt.

## 2024-07-25 NOTE — Patient Instructions (Addendum)
 SURGICAL WAITING ROOM VISITATION  Patients having surgery or a procedure may have no more than 2 support people in the waiting area - these visitors may rotate.    Children under the age of 22 must have an adult with them who is not the patient.  Visitors with respiratory illnesses are discouraged from visiting and should remain at home.  If the patient needs to stay at the hospital during part of their recovery, the visitor guidelines for inpatient rooms apply. Pre-op nurse will coordinate an appropriate time for 1 support person to accompany patient in pre-op.  This support person may not rotate.    Please refer to the [facility] website for the visitor guidelines for Inpatients (after your surgery is over and you are in a regular room).       Your procedure is scheduled on:   08/05/2024    Report to [facility] Main Entrance    Report to admitting at  0515 AM   Call this number if you have problems the morning of surgery [phone]   Do not eat food :After Midnight.   After Midnight you may have the following liquids until _ 0430_____ AM/ DAY OF SURGERY  Water Non-Citrus Juices (without pulp, NO RED-Apple, White grape, White cranberry) Black Coffee (NO MILK/CREAM OR CREAMERS, sugar ok)  Clear Tea (NO MILK/CREAM OR CREAMERS, sugar ok) regular and decaf                             Plain Jell-O (NO RED)                                           Fruit ices (not with fruit pulp, NO RED)                                     Popsicles (NO RED)                                                               Sports drinks like Gatorade (NO RED)                    The day of surgery:  Drink ONE (1) Pre-Surgery Clear Ensure or G2 at  0430AM ( have completed by )  the morning of surgery. Drink in one sitting. Do not sip.  This drink was given to you during your hospital  pre-op appointment visit. Nothing else to drink after completing the  Pre-Surgery Clear Ensure or G2.           If you have questions, please contact your surgeon's office.        Oral Hygiene is also important to reduce your risk of infection.                                    Remember - BRUSH YOUR TEETH THE MORNING OF SURGERY WITH YOUR REGULAR TOOTHPASTE  DENTURES WILL BE REMOVED PRIOR TO SURGERY  PLEASE DO NOT APPLY Poly grip OR ADHESIVES!!!   Do NOT smoke after Midnight   Stop all vitamins and herbal supplements 7 days before surgery.   Take these medicines the morning of surgery with A SIP OF WATER:  prozac , omeprazole   DO NOT TAKE ANY ORAL DIABETIC MEDICATIONS DAY OF YOUR SURGERY  Bring CPAP mask and tubing day of surgery.                              You may not have any metal on your body including hair pins, jewelry, and body piercing             Do not wear make-up, lotions, powders, perfumes/cologne, or deodorant  Do not wear nail polish including gel and S&S, artificial/acrylic nails, or any other type of covering on natural nails including finger and toenails. If you have artificial nails, gel coating, etc. that needs to be removed by a nail salon please have this removed prior to surgery or surgery may need to be canceled/ delayed if the surgeon/ anesthesia feels like they are unable to be safely monitored.   Do not shave  48 hours prior to surgery.               Men may shave face and neck.   Do not bring valuables to the hospital. [place] IS NOT             RESPONSIBLE   FOR VALUABLES.   Contacts, glasses, dentures or bridgework may not be worn into surgery.   Bring small overnight bag day of surgery.   DO NOT BRING YOUR HOME MEDICATIONS TO THE HOSPITAL. PHARMACY WILL DISPENSE MEDICATIONS LISTED ON YOUR MEDICATION LIST TO YOU DURING YOUR ADMISSION IN THE HOSPITAL!    Patients discharged on the day of surgery will not be allowed to drive home.  Someone NEEDS to stay with you for the first 24 hours after anesthesia.   Special Instructions: Bring a copy  of your healthcare power of attorney and living will documents the day of surgery if you haven't scanned them before.              Please read over the following fact sheets you were given: IF YOU HAVE QUESTIONS ABOUT YOUR PRE-OP INSTRUCTIONS PLEASE CALL [phone].   If you received a COVID test during your pre-op visit  it is requested that you wear a mask when out in public, stay away from anyone that may not be feeling well and notify your surgeon if you develop symptoms. If you test positive for Covid or have been in contact with anyone that has tested positive in the last 10 days please notify you surgeon.      Pre-operative 4 CHG Bath Instructions   You can play a key role in reducing the risk of infection after surgery. Your skin needs to be as free of germs as possible. You can reduce the number of germs on your skin by washing with CHG (chlorhexidine gluconate) soap before surgery. CHG is an antiseptic soap that kills germs and continues to kill germs even after washing.   DO NOT use if you have an allergy to chlorhexidine/CHG or antibacterial soaps. If your skin becomes reddened or irritated, stop using the CHG and notify one of our RNs at [phone].   Please shower with the CHG soap starting 4 days before surgery using the following schedule:  Please keep in mind the following:  DO NOT shave, including legs and underarms, starting the day of your first shower.   You may shave your face at any point before/day of surgery.  Place clean sheets on your bed the day you start using CHG soap. Use a clean washcloth (not used since being washed) for each shower. DO NOT sleep with pets once you start using the CHG.   CHG Shower Instructions:  If you choose to wash your hair and private area, wash first with your normal shampoo/soap.  After you use shampoo/soap, rinse your hair and body thoroughly to remove shampoo/soap residue.  Turn the water OFF and apply about 3 tablespoons (45  ml) of CHG soap to a CLEAN washcloth.  Apply CHG soap ONLY FROM YOUR NECK DOWN TO YOUR TOES (washing for 3-5 minutes)  DO NOT use CHG soap on face, private areas, open wounds, or sores.  Pay special attention to the area where your surgery is being performed.  If you are having back surgery, having someone wash your back for you may be helpful. Wait 2 minutes after CHG soap is applied, then you may rinse off the CHG soap.  Pat dry with a clean towel  Put on clean clothes/pajamas   If you choose to wear lotion, please use ONLY the CHG-compatible lotions on the back of this paper.     Additional instructions for the day of surgery: DO NOT APPLY any lotions, deodorants, cologne, or perfumes.   Put on clean/comfortable clothes.  Brush your teeth.  Ask your nurse before applying any prescription medications to the skin.      CHG Compatible Lotions   Aveeno Moisturizing lotion  Cetaphil Moisturizing Cream  Cetaphil Moisturizing Lotion  Clairol Herbal Essence Moisturizing Lotion, Dry Skin  Clairol Herbal Essence Moisturizing Lotion, Extra Dry Skin  Clairol Herbal Essence Moisturizing Lotion, Normal Skin  Curel Age Defying Therapeutic Moisturizing Lotion with Alpha Hydroxy  Curel Extreme Care Body Lotion  Curel Soothing Hands Moisturizing Hand Lotion  Curel Therapeutic Moisturizing Cream, Fragrance-Free  Curel Therapeutic Moisturizing Lotion, Fragrance-Free  Curel Therapeutic Moisturizing Lotion, Original Formula  Eucerin Daily Replenishing Lotion  Eucerin Dry Skin Therapy Plus Alpha Hydroxy Crme  Eucerin Dry Skin Therapy Plus Alpha Hydroxy Lotion  Eucerin Original Crme  Eucerin Original Lotion  Eucerin Plus Crme Eucerin Plus Lotion  Eucerin TriLipid Replenishing Lotion  Keri Anti-Bacterial Hand Lotion  Keri Deep Conditioning Original Lotion Dry Skin Formula Softly Scented  Keri Deep Conditioning Original Lotion, Fragrance Free Sensitive Skin Formula  Keri Lotion Fast Absorbing  Fragrance Free Sensitive Skin Formula  Keri Lotion Fast Absorbing Softly Scented Dry Skin Formula  Keri Original Lotion  Keri Skin Renewal Lotion Keri Silky Smooth Lotion  Keri Silky Smooth Sensitive Skin Lotion  Nivea Body Creamy Conditioning Oil  Nivea Body Extra Enriched Lotion  Nivea Body Original Lotion  Nivea Body Sheer Moisturizing Lotion Nivea Crme  Nivea Skin Firming Lotion  NutraDerm 30 Skin Lotion  NutraDerm Skin Lotion  NutraDerm Therapeutic Skin Cream  NutraDerm Therapeutic Skin Lotion  ProShield Protective Hand Cream  Provon moisturizing lotion  [place]- Preparing for Total Shoulder Arthroplasty    Before surgery, you can play an important role. Because skin is not sterile, your skin needs to be as free of germs as possible. You can reduce the number of germs on your skin by using the following products. Benzoyl Peroxide Gel Reduces the number of germs present on the skin Applied twice a  day to shoulder area starting two days before surgery    ==================================================================  Please follow these instructions carefully:  BENZOYL PEROXIDE 5% GEL  Please do not use if you have an allergy to benzoyl peroxide.   If your skin becomes reddened/irritated stop using the benzoyl peroxide.  Starting two days before surgery, apply as follows: Apply benzoyl peroxide in the morning and at night. Apply after taking a shower. If you are not taking a shower clean entire shoulder front, back, and side along with the armpit with a clean wet washcloth.  Place a quarter-sized dollop on your shoulder and rub in thoroughly, making sure to cover the front, back, and side of your shoulder, along with the armpit.   2 days before ____ AM   ____ PM              1 day before ____ AM   ____ PM                         Do this twice a day for two days.  (Last application is the night before surgery, AFTER using the CHG soap as described below).  Do NOT  apply benzoyl peroxide gel on the day of surgery.

## 2024-07-28 ENCOUNTER — Other Ambulatory Visit: Payer: Self-pay

## 2024-07-28 ENCOUNTER — Encounter (HOSPITAL_COMMUNITY)
Admission: RE | Admit: 2024-07-28 | Discharge: 2024-07-28 | Disposition: A | Discharge status: Home / Self Care | Source: Ambulatory Visit | Attending: Orthopedic Surgery | Admitting: Orthopedic Surgery

## 2024-07-28 ENCOUNTER — Encounter (HOSPITAL_COMMUNITY): Payer: Self-pay

## 2024-07-28 VITALS — BP 122/84 | HR 62 | Temp 99.1°F | Resp 16 | Ht 62.75 in | Wt 138.0 lb

## 2024-07-28 DIAGNOSIS — Z01812 Encounter for preprocedural laboratory examination: Secondary | ICD-10-CM | POA: Diagnosis not present

## 2024-07-28 DIAGNOSIS — Z01818 Encounter for other preprocedural examination: Secondary | ICD-10-CM

## 2024-07-28 HISTORY — DX: Nausea with vomiting, unspecified: R11.2

## 2024-07-28 HISTORY — DX: Unspecified osteoarthritis, unspecified site: M19.90

## 2024-07-28 HISTORY — DX: Cardiac murmur, unspecified: R01.1

## 2024-07-28 HISTORY — DX: Other complications of anesthesia, initial encounter: T88.59XA

## 2024-07-28 LAB — CBC
HCT: 41.7 % (ref 36.0–46.0)
Hemoglobin: 13.5 g/dL (ref 12.0–15.0)
MCH: 29.5 pg (ref 26.0–34.0)
MCHC: 32.4 g/dL (ref 30.0–36.0)
MCV: 91.2 fL (ref 80.0–100.0)
Platelets: 214 K/uL (ref 150–400)
RBC: 4.57 MIL/uL (ref 3.87–5.11)
RDW: 14.4 % (ref 11.5–15.5)
WBC: 6.3 K/uL (ref 4.0–10.5)
nRBC: 0 % (ref 0.0–0.2)

## 2024-07-28 LAB — SURGICAL PCR SCREEN
MRSA, PCR: NEGATIVE
Staphylococcus aureus: NEGATIVE

## 2024-08-04 NOTE — H&P (Signed)
 SHOULDER ARTHROPLASTY ADMISSION H&P  Patient ID: Zona Pedro MRN: [id number] DOB/AGE: 1966-03-22 58 y.o.  Chief Complaint: left shoulder pain.  Planned Procedure Date: 08/05/24 Medical Clearance by Dr. Conley     HPI: Momina Hunton is a 58 y.o. 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 The patient has a history of pain and functional disability in the left shoulder due to arthritis and has failed non-surgical conservative treatments for greater than 12 weeks to include NSAID's and/or analgesics, corticosteriod injections, and activity modification.  Onset of symptoms was gradual, starting 2 years ago with gradually worsening course since that time. The patient noted no past surgery on the left shoulder.  Patient currently rates pain at 8 out of 10 with activity. Patient has worsening of pain with activity and weight bearing and pain that interferes with activities of daily living.  Patient has evidence of joint space narrowing by imaging studies.  There is no active infection.  Past Medical History:  Diagnosis Date   Anxiety    Arthritis    Complication of anesthesia    GERD (gastroesophageal reflux disease)    Heart murmur    slight- not heard by PCP   History of adenomatous polyp of colon    12-17-2015  TUBULAR ADENOMA AND HYPERPLASTIC   History of cardiac murmur    per pt told was mild at 1989 surgery   History of multiple concussions    per pt last one 1992 , approx.,  residual migraines which is along with hereditary migraines   Migraine    PMB (postmenopausal bleeding)    PONV (postoperative nausea and vomiting)    Wears glasses    Past Surgical History:  Procedure Laterality Date   CARPAL TUNNEL RELEASE Right 01/29/2009   COLONOSCOPY  12/17/2015   DILATATION & CURETTAGE/HYSTEROSCOPY WITH MYOSURE N/A 03/26/2017   Procedure: DILATATION & CURETTAGE/HYSTEROSCOPY WITH MYOSURE;  Surgeon: Jannis Kate Norris, MD;  Location: [facility];   Service: Gynecology;  Laterality: N/A;   left knee replacement      MANDIBLE SURGERY  1989   complete mandible reconstruction for previous injury as younger child--  per pt retained hardware   NASAL SINUS SURGERY  2002   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 Authorizing Provider  CALCIUM PO Take 600 mg by mouth.    [provider]  chlorproMAZINE (THORAZINE) 25 MG tablet Take 1-2 tablets by mouth as needed.  07/17/15   [provider]  co-enzyme Q-10 30 MG capsule Take 30 mg by mouth 3 (three) times daily.    [provider]  FLUoxetine  (PROZAC ) 10 MG capsule Take 1 capsule (10 mg total) by mouth daily. 05/31/22   Jertson, Jill Evelyn, MD  GOODSENSE PAIN RELIEF EXTRA ST 500 MG tablet Take 1,000 mg by mouth every 8 (eight) hours as needed. 02/11/20   [provider]  Melatonin 5 MG CAPS Take by mouth at bedtime.     [provider]  Multiple Vitamin (MULTIVITAMIN) capsule Take 1 capsule by mouth daily.    [provider]  omeprazole (PRILOSEC) 40 MG capsule Take 40 mg by mouth every morning.     [provider]  Probiotic Product (PHILLIPS COLON HEALTH PO) Take by mouth.  [provider]  QUDEXY XR 200 MG CS24 Take 2 capsules by mouth every morning.  12/13/16   [provider]  Riboflavin (B2 PO) Take by mouth.    [provider]  UNABLE TO FIND Take by mouth 1 day or 1 dose. Med Name: estrovin sleep cool    [provider]  UNABLE TO FIND Erenest- bid  Name: gastrocare and REflux Raft  Reflux Raft - teaspoon an hour after eating    [provider]  vitamin B-12 (CYANOCOBALAMIN) 100 MCG tablet Take 100 mcg by mouth daily.    [provider]  zolmitriptan (ZOMIG) 5 MG nasal  solution Place into the nose as needed for migraine.    [provider]   Social History   Socioeconomic History   Marital status: Widowed    Spouse name: Not on file   Number of children: 0   Years of education: Not on file   Highest education level: Not on file  Occupational History    Employer: [employer]  Tobacco Use   Smoking status: Former    Current packs/day: 0.00    Average packs/day: 1 pack/day for 15.0 years (15.0 ttl pk-yrs)    Types: Cigarettes    Start date: 12/28/1996    Quit date: 12/29/2011    Years since quitting: 12.6   Smokeless tobacco: Never  Vaping Use   Vaping status: Never Used  Substance and Sexual Activity   Alcohol use: Yes    Alcohol/week: 8.0 standard drinks of alcohol    Types: 8 Glasses of wine per week    Comment: occas   Drug use: No   Sexual activity: Yes    Partners: Male    Birth control/protection: Post-menopausal  Other Topics Concern   Not on file  Social History Narrative   Not on file   Social Drivers of Health   Financial Resource Strain: Not on file  Food Insecurity: Not on file  Transportation Needs: Not on file  Physical Activity: Not on file  Stress: Not on file  Social Connections: Not on file   Family History  Problem Relation Age of Onset   Alzheimer's disease Mother    Hyperlipidemia Mother    Thyroid  disease Mother    Diabetes Father    Stroke Father    Hashimoto's thyroiditis Sister     ROS: Currently denies lightheadedness, dizziness, Fever, chills, CP, SOB.   No personal history of DVT, PE, MI, or CVA. No loose teeth or dentures All other systems have been reviewed and were otherwise currently negative with the exception of those mentioned in the HPI and as above.  BMI: Estimated body mass index is 24.64 kg/m as calculated from the following:   Height as of 07/28/24: 5' 2.75 (1.594 m).   Weight as of 07/28/24: 62.6 kg.  Lab Results  Component Value Date   ALBUMIN 4.1 05/19/2019    Diabetes: Patient does not have a diagnosis of diabetes.     Smoking Status:    Former Smoker  Objective: Vitals: Height 5 feet 2 inches, weight 143 pounds, temperature 98.7, BP 128/84, pulse 77, O2 100% on room air. Physical Exam: General: Alert, NAD.   HEENT: EOMI, Good Neck Extension  Pulm: No increased work of breathing.  Clear B/L A/P w/o crackle or wheeze.  CV: RRR, No m/g/r appreciated  GI: soft, NT, ND Neuro: Neuro without gross focal deficit.  Sensation intact distally Skin: No lesions in the area of chief complaint MSK/Surgical  Site: left shoulder pain with range of motion. She has 0 to 90 degrees of forward flexion of the left shoulder, 40 degrees of external rotation, and internal rotation to the sacrum. All fingers flex, extend, and adduct. Distal sensation is intact. 2+ radial pulse.  Imaging Review CT scan performed 05/05/2024 shows severe osteoarthritis of the left glenohumeral joint. Rotator cuff suboptimally viewed but looks to be intact.  Assessment: OA LEFT SHOULDER  Plan: Plan for Procedure(s): ARTHROPLASTY, SHOULDER, TOTAL  The patient history, physical exam, clinical judgement of the provider and imaging are consistent with end stage degenerative joint disease and total joint arthroplasty is deemed medically necessary. The treatment options including medical management, injection therapy, and arthroplasty were discussed at length. The risks and benefits of Procedure(s): ARTHROPLASTY, SHOULDER, TOTAL were presented and reviewed.  The risks of nonoperative treatment, versus surgical intervention including but not limited to continued pain, aseptic loosening, stiffness, dislocation/subluxation, infection, bleeding, nerve injury, blood clots, cardiopulmonary complications, morbidity, mortality, among others were discussed. The patient verbalizes understanding and wishes to proceed with the plan.  Patient is being admitted for surgery, OT, pain control,  prophylactic antibiotics, VTE prophylaxis, progressive ambulation, ADL's and discharge planning.   The patient does meet the criteria for TXA which will be used perioperatively.     Subhan Hoopes K Kadija Cruzen, PA-C 08/04/2024 12:12 PM

## 2024-08-05 ENCOUNTER — Encounter (HOSPITAL_COMMUNITY): Payer: Self-pay | Admitting: Orthopedic Surgery

## 2024-08-05 ENCOUNTER — Encounter (HOSPITAL_COMMUNITY)
Admission: RE | Disposition: A | Discharge status: Home / Self Care | Payer: Self-pay | Source: Ambulatory Visit | Attending: Orthopedic Surgery

## 2024-08-05 ENCOUNTER — Ambulatory Visit (HOSPITAL_COMMUNITY)

## 2024-08-05 ENCOUNTER — Ambulatory Visit (HOSPITAL_COMMUNITY): Payer: Self-pay | Admitting: Anesthesiology

## 2024-08-05 ENCOUNTER — Ambulatory Visit (HOSPITAL_COMMUNITY)
Admission: RE | Admit: 2024-08-05 | Discharge: 2024-08-05 | Disposition: A | Discharge status: Home / Self Care | Source: Ambulatory Visit | Attending: Orthopedic Surgery | Admitting: Orthopedic Surgery

## 2024-08-05 ENCOUNTER — Ambulatory Visit (HOSPITAL_COMMUNITY): Payer: Self-pay | Admitting: Medical

## 2024-08-05 DIAGNOSIS — K219 Gastro-esophageal reflux disease without esophagitis: Secondary | ICD-10-CM | POA: Diagnosis not present

## 2024-08-05 DIAGNOSIS — Z01818 Encounter for other preprocedural examination: Secondary | ICD-10-CM

## 2024-08-05 DIAGNOSIS — F419 Anxiety disorder, unspecified: Secondary | ICD-10-CM | POA: Diagnosis not present

## 2024-08-05 DIAGNOSIS — M19012 Primary osteoarthritis, left shoulder: Secondary | ICD-10-CM | POA: Diagnosis present

## 2024-08-05 DIAGNOSIS — Z87891 Personal history of nicotine dependence: Secondary | ICD-10-CM | POA: Insufficient documentation

## 2024-08-05 DIAGNOSIS — M25712 Osteophyte, left shoulder: Secondary | ICD-10-CM | POA: Diagnosis not present

## 2024-08-05 HISTORY — PX: TOTAL SHOULDER ARTHROPLASTY: SHX126

## 2024-08-05 SURGERY — ARTHROPLASTY, SHOULDER, TOTAL
Anesthesia: General | Site: Shoulder | Laterality: Left

## 2024-08-05 MED ORDER — CEFAZOLIN SODIUM-DEXTROSE 2-4 GM/100ML-% IV SOLN
2.0000 g | INTRAVENOUS | Status: AC
  Administered 2024-08-05: 2 g via INTRAVENOUS
  Filled 2024-08-05: qty 100

## 2024-08-05 MED ORDER — METHOCARBAMOL 1000 MG/10ML IJ SOLN
500.0000 mg | Freq: Four times a day (QID) | INTRAMUSCULAR | Status: DC | PRN
Start: 2024-08-05 — End: 2024-08-05

## 2024-08-05 MED ORDER — ONDANSETRON HCL 4 MG PO TABS: 4.0000 mg | ORAL_TABLET | Freq: Three times a day (TID) | ORAL | 0 refills | Status: AC | PRN

## 2024-08-05 MED ORDER — ROCURONIUM BROMIDE 100 MG/10ML IV SOLN
INTRAVENOUS | Status: DC | PRN
  Administered 2024-08-05: 20 mg via INTRAVENOUS
  Administered 2024-08-05: 10 mg via INTRAVENOUS
  Administered 2024-08-05: 60 mg via INTRAVENOUS
  Administered 2024-08-05: 10 mg via INTRAVENOUS

## 2024-08-05 MED ORDER — SUGAMMADEX SODIUM 200 MG/2ML IV SOLN
INTRAVENOUS | Status: AC
  Filled 2024-08-05: qty 2

## 2024-08-05 MED ORDER — LIDOCAINE HCL (CARDIAC) PF 100 MG/5ML IV SOSY
PREFILLED_SYRINGE | INTRAVENOUS | Status: DC | PRN
  Administered 2024-08-05: 80 mg via INTRAVENOUS

## 2024-08-05 MED ORDER — PROPOFOL 10 MG/ML IV BOLUS
INTRAVENOUS | Status: DC | PRN
  Administered 2024-08-05: 120 mg via INTRAVENOUS

## 2024-08-05 MED ORDER — 0.9 % SODIUM CHLORIDE (POUR BTL) OPTIME
TOPICAL | Status: DC | PRN
  Administered 2024-08-05: 1000 mL

## 2024-08-05 MED ORDER — ROCURONIUM BROMIDE 10 MG/ML (PF) SYRINGE
PREFILLED_SYRINGE | INTRAVENOUS | Status: AC
  Filled 2024-08-05: qty 10

## 2024-08-05 MED ORDER — BUPIVACAINE LIPOSOME 1.3 % IJ SUSP
INTRAMUSCULAR | Status: DC | PRN
  Administered 2024-08-05: 10 mL via PERINEURAL

## 2024-08-05 MED ORDER — DEXAMETHASONE SOD PHOSPHATE PF 10 MG/ML IJ SOLN
INTRAMUSCULAR | Status: DC | PRN
  Administered 2024-08-05: 4 mg via INTRAVENOUS

## 2024-08-05 MED ORDER — HYDROCODONE-ACETAMINOPHEN 10-325 MG PO TABS: 1.0000 | ORAL_TABLET | ORAL | 0 refills | Status: AC | PRN

## 2024-08-05 MED ORDER — METHOCARBAMOL 500 MG PO TABS
500.0000 mg | ORAL_TABLET | Freq: Four times a day (QID) | ORAL | Status: DC | PRN
Start: 2024-08-05 — End: 2024-08-05

## 2024-08-05 MED ORDER — ACETAMINOPHEN 500 MG PO TABS
1000.0000 mg | ORAL_TABLET | Freq: Once | ORAL | Status: DC
  Filled 2024-08-05: qty 2

## 2024-08-05 MED ORDER — FENTANYL CITRATE (PF) 100 MCG/2ML IJ SOLN
INTRAMUSCULAR | Status: AC
  Filled 2024-08-05: qty 2

## 2024-08-05 MED ORDER — PHENYLEPHRINE HCL (PRESSORS) 10 MG/ML IV SOLN
INTRAVENOUS | Status: DC | PRN
  Administered 2024-08-05 (×2): 80 ug via INTRAVENOUS

## 2024-08-05 MED ORDER — PHENYLEPHRINE HCL-NACL 20-0.9 MG/250ML-% IV SOLN
INTRAVENOUS | Status: AC
  Filled 2024-08-05: qty 250

## 2024-08-05 MED ORDER — FENTANYL CITRATE (PF) 100 MCG/2ML IJ SOLN
INTRAMUSCULAR | Status: DC | PRN
  Administered 2024-08-05: 25 ug via INTRAVENOUS
  Administered 2024-08-05: 50 ug via INTRAVENOUS

## 2024-08-05 MED ORDER — ORAL CARE MOUTH RINSE: 15.0000 mL | Freq: Once | OROMUCOSAL | Status: AC

## 2024-08-05 MED ORDER — CHLORHEXIDINE GLUCONATE 0.12 % MT SOLN
15.0000 mL | Freq: Once | OROMUCOSAL | Status: AC
  Administered 2024-08-05: 15 mL via OROMUCOSAL

## 2024-08-05 MED ORDER — FENTANYL CITRATE (PF) 50 MCG/ML IJ SOSY: 25.0000 ug | PREFILLED_SYRINGE | INTRAMUSCULAR | Status: DC | PRN

## 2024-08-05 MED ORDER — SUGAMMADEX SODIUM 200 MG/2ML IV SOLN
INTRAVENOUS | Status: DC | PRN
Start: 2024-08-05 — End: 2024-08-05
  Administered 2024-08-05: 200 mg via INTRAVENOUS

## 2024-08-05 MED ORDER — ONDANSETRON HCL 4 MG/2ML IJ SOLN
INTRAMUSCULAR | Status: DC | PRN
  Administered 2024-08-05: 4 mg via INTRAVENOUS

## 2024-08-05 MED ORDER — ONDANSETRON HCL 4 MG/2ML IJ SOLN
INTRAMUSCULAR | Status: AC
  Filled 2024-08-05: qty 2

## 2024-08-05 MED ORDER — LIDOCAINE HCL (PF) 2 % IJ SOLN
INTRAMUSCULAR | Status: AC
  Filled 2024-08-05: qty 5

## 2024-08-05 MED ORDER — SODIUM CHLORIDE 0.9 % IR SOLN
Status: DC | PRN
  Administered 2024-08-05: 1000 mL

## 2024-08-05 MED ORDER — SENNA-DOCUSATE SODIUM 8.6-50 MG PO TABS: 2.0000 | ORAL_TABLET | Freq: Every day | ORAL | 1 refills | Status: AC

## 2024-08-05 MED ORDER — TRANEXAMIC ACID-NACL 1000-0.7 MG/100ML-% IV SOLN
1000.0000 mg | INTRAVENOUS | Status: AC
  Administered 2024-08-05: 1000 mg via INTRAVENOUS
  Filled 2024-08-05: qty 100

## 2024-08-05 MED ORDER — EPHEDRINE SULFATE (PRESSORS) 50 MG/ML IJ SOLN
INTRAMUSCULAR | Status: DC | PRN
  Administered 2024-08-05: 10 mg via INTRAVENOUS

## 2024-08-05 MED ORDER — EPHEDRINE 5 MG/ML INJ
INTRAVENOUS | Status: AC
  Filled 2024-08-05: qty 5

## 2024-08-05 MED ORDER — METHOCARBAMOL 500 MG PO TABS: 500.0000 mg | ORAL_TABLET | Freq: Three times a day (TID) | ORAL | 0 refills | Status: AC | PRN

## 2024-08-05 MED ORDER — DROPERIDOL 2.5 MG/ML IJ SOLN: 0.6250 mg | Freq: Once | INTRAMUSCULAR | Status: DC | PRN

## 2024-08-05 MED ORDER — MIDAZOLAM HCL 2 MG/2ML IJ SOLN
INTRAMUSCULAR | Status: AC
  Filled 2024-08-05: qty 2

## 2024-08-05 MED ORDER — BUPIVACAINE HCL (PF) 0.5 % IJ SOLN
INTRAMUSCULAR | Status: DC | PRN
  Administered 2024-08-05: 15 mL via PERINEURAL

## 2024-08-05 MED ORDER — MIDAZOLAM HCL 5 MG/5ML IJ SOLN
INTRAMUSCULAR | Status: DC | PRN
  Administered 2024-08-05 (×2): .5 mg via INTRAVENOUS
  Administered 2024-08-05: 1 mg via INTRAVENOUS

## 2024-08-05 MED ORDER — POVIDONE-IODINE 10 % EX SWAB: 2.0000 | Freq: Once | CUTANEOUS | Status: DC

## 2024-08-05 MED ORDER — ACETAMINOPHEN 10 MG/ML IV SOLN: 1000.0000 mg | Freq: Once | INTRAVENOUS | Status: DC | PRN

## 2024-08-05 MED ORDER — PROPOFOL 500 MG/50ML IV EMUL
INTRAVENOUS | Status: AC
  Filled 2024-08-05: qty 50

## 2024-08-05 MED ORDER — STERILE WATER FOR IRRIGATION IR SOLN
Status: DC | PRN
  Administered 2024-08-05: 2000 mL

## 2024-08-05 MED ORDER — LACTATED RINGERS IV SOLN
INTRAVENOUS | Status: DC
Start: 2024-08-05 — End: 2024-08-05

## 2024-08-05 MED ORDER — ACETAMINOPHEN 500 MG PO TABS
1000.0000 mg | ORAL_TABLET | Freq: Once | ORAL | Status: AC
  Administered 2024-08-05: 1000 mg via ORAL

## 2024-08-05 SURGICAL SUPPLY — 52 items
BAG COUNTER SPONGE SURGICOUNT (BAG) IMPLANT
BAG ZIPLOCK 12X15 (MISCELLANEOUS) ×2 IMPLANT
BLADE SAW SGTL 73X25 THK (BLADE) ×2 IMPLANT
CEMENT BONE R 1X40 (Cement) IMPLANT
CLSR STERI-STRIP ANTIMIC 1/2X4 (GAUZE/BANDAGES/DRESSINGS) ×2 IMPLANT
COOLER ICEMAN CLASSIC (MISCELLANEOUS) ×2 IMPLANT
COVER BACK TABLE 60X90IN (DRAPES) ×2 IMPLANT
COVER MAYO STAND STRL (DRAPES) ×2 IMPLANT
COVER SURGICAL LIGHT HANDLE (MISCELLANEOUS) ×2 IMPLANT
DRAPE IMP U-DRAPE 54X76 (DRAPES) ×4 IMPLANT
DRAPE POUCH INSTRU U-SHP 10X18 (DRAPES) ×2 IMPLANT
DRAPE SHEET LG 3/4 BI-LAMINATE (DRAPES) ×4 IMPLANT
DRAPE SURG 17X11 SM STRL (DRAPES) ×2 IMPLANT
DRAPE U-SHAPE 47X51 STRL (DRAPES) ×2 IMPLANT
DRSG AQUACEL AG ADV 3.5X 6 (GAUZE/BANDAGES/DRESSINGS) IMPLANT
DRSG MEPILEX POST OP 4X8 (GAUZE/BANDAGES/DRESSINGS) ×2 IMPLANT
DURAPREP 26ML APPLICATOR (WOUND CARE) ×4 IMPLANT
ELECT REM PT RETURN 15FT ADLT (MISCELLANEOUS) ×2 IMPLANT
FACESHIELD WRAPAROUND OR TEAM (MASK) ×2 IMPLANT
GLENOID MOD PE 4 PEG SZ 3 (Shoulder) IMPLANT
GLENOID MOD POST TM SZ2 (Post) IMPLANT
GLOVE BIO SURGEON STRL SZ 6.5 (GLOVE) ×2 IMPLANT
GLOVE BIOGEL PI IND STRL 7.0 (GLOVE) ×2 IMPLANT
GLOVE BIOGEL PI IND STRL 8 (GLOVE) ×2 IMPLANT
GLOVE ORTHO TXT STRL SZ7.5 (GLOVE) ×2 IMPLANT
GOWN STRL SURGICAL XL XLNG (GOWN DISPOSABLE) ×4 IMPLANT
HEAD HUMERAL BIPOLAR 38X19X39 (Miscellaneous) IMPLANT
HEAD HUMERAL COMP STD (Orthopedic Implant) IMPLANT
HOOD PEEL AWAY T7 (MISCELLANEOUS) ×4 IMPLANT
KIT BASIN OR (CUSTOM PROCEDURE TRAY) ×2 IMPLANT
KIT TURNOVER KIT A (KITS) ×2 IMPLANT
NS IRRIG 1000ML POUR BTL (IV SOLUTION) ×2 IMPLANT
PACK SHOULDER (CUSTOM PROCEDURE TRAY) ×2 IMPLANT
PAD ARMBOARD POSITIONER FOAM (MISCELLANEOUS) ×2 IMPLANT
PAD COLD SHLDR WRAP-ON (PAD) ×2 IMPLANT
PIN THREADED REVERSE (PIN) IMPLANT
RESTRAINT HEAD UNIVERSAL NS (MISCELLANEOUS) ×2 IMPLANT
SET HNDPC FAN SPRY TIP SCT (DISPOSABLE) ×2 IMPLANT
SLING ARM IMMOBILIZER LRG (SOFTGOODS) ×2 IMPLANT
SPIKE FLUID TRANSFER (MISCELLANEOUS) IMPLANT
STEM HUMERAL STRL 7MMX55MM (Stem) IMPLANT
SUCTION TUBE FRAZIER 12FR DISP (SUCTIONS) ×2 IMPLANT
SUPPORT WRAP ARM LG (MISCELLANEOUS) ×2 IMPLANT
SUT MAXBRAID #2 CVD NDL (SUTURE) ×2 IMPLANT
SUT MAXBRAID #5 CCS-NDL 2PK (SUTURE) ×4 IMPLANT
SUT MON AB 3-0 SH27 (SUTURE) IMPLANT
SUT VIC AB 0 CT1 36 (SUTURE) ×2 IMPLANT
SUT VIC AB 2-0 CT1 TAPERPNT 27 (SUTURE) ×2 IMPLANT
SUT VIC AB 3-0 SH 27X BRD (SUTURE) IMPLANT
TOWEL OR 17X26 10 PK STRL BLUE (TOWEL DISPOSABLE) ×2 IMPLANT
TOWER SMARTMIX MINI (MISCELLANEOUS) IMPLANT
WATER STERILE IRR 1000ML POUR (IV SOLUTION) ×4 IMPLANT

## 2024-08-05 NOTE — Anesthesia Procedure Notes (Addendum)
 Anesthesia Regional Block: Interscalene brachial plexus block   Pre-Anesthetic Checklist: , timeout performed,  Correct Patient, Correct Site, Correct Laterality,  Correct Procedure, Correct Position, site marked,  Risks and benefits discussed,  Surgical consent,  Pre-op evaluation,  At surgeon's request and post-op pain management  Laterality: Left  Prep: chloraprep       Needles:  Injection technique: Single-shot  Needle Type: Echogenic Needle     Needle Length: 4cm  Needle Gauge: 25     Additional Needles:   Procedures:,,,, ultrasound used (permanent image in chart),,    Narrative:  Start time: 08/05/2024 7:12 AM End time: 08/05/2024 7:15 AM Injection made incrementally with aspirations every 5 mL.  Performed by: Personally  Anesthesiologist: Boone Fess, MD  Additional Notes: Patient's chart reviewed and they were deemed appropriate candidate for procedure, at surgeon's request. Patient educated about risks, benefits, and alternatives of the block including but not limited to: temporary or permanent nerve damage, bleeding, infection, damage to surround tissues, pneumothorax, hemidiaphragmatic paralysis, unilateral Horner's syndrome, block failure, local anesthetic toxicity. Patient expressed understanding. A formal time-out was conducted consistent with institution rules.  Monitors were applied, and minimal sedation used (see nursing record). The site was prepped with skin prep and allowed to dry, and sterile gloves were used. A high frequency linear ultrasound probe with probe cover was utilized throughout. C5-7 nerve roots located and appeared anatomically normal, local anesthetic injected around them, and echogenic block needle trajectory was monitored throughout. Aspiration performed every 5ml. Lung and blood vessels were avoided. All injections were performed without resistance and free of blood and paresthesias. The patient tolerated the procedure well.  Injectate: 10ml  exparel  + 15ml 0.5% bupivacaine 

## 2024-08-05 NOTE — Op Note (Signed)
 08/05/2024  9:35 AM  PATIENT:  Amy Barr    PRE-OPERATIVE DIAGNOSIS: Left shoulder primary localized osteoarthritis  POST-OPERATIVE DIAGNOSIS:  Same  PROCEDURE: Left total Shoulder Arthroplasty  SURGEON:  [name], MD  PHYSICIAN ASSISTANT: Army Daring, PA-C, present and scrubbed throughout the case, critical for completion in a timely fashion, and for retraction, instrumentation, and closure.  ANESTHESIA:   General with an interscalene block  ESTIMATED BLOOD LOSS: 150 mL  UNIQUE ASPECTS OF THE CASE: Her tissue was fairly flexible, and she had a very large effusion, with a lot of intra-articular fluid and subdeltoid fluid.  I was slightly anterior with the glenoid, although I did have all 4 holes contained, but the anterior 1 was adjacent to the anterior rim.  Her anatomy was fairly small.  PREOPERATIVE INDICATIONS:  Amy Barr is a  58 y.o. female who failed conservative measures and elected for surgical management.    The risks benefits and alternatives were discussed with the patient preoperatively including but not limited to the risks of infection, bleeding, nerve injury, cardiopulmonary complications, the need for revision surgery, dislocation, loosening, incomplete relief of pain, among others, and the patient was willing to proceed.   OPERATIVE IMPLANTS: Implant Name: CEMENT BONE R 1X40 - [id number] Type: Cement Inv. Item: CEMENT BONE R 1X40 Serial No.:  Manufacturer: [employer](ORTH,TRAU,BIO,SG) Lot No.: [id number] LRB: Left No. Used: 1 Action: Implanted   Implant Name: GLENOID MOD PE 4 PEG SZ 3 - [id number] Type: Shoulder Inv. Item: GLENOID MOD PE 4 PEG SZ 3 Serial No.:  Manufacturer: [employer](ORTH,TRAU,BIO,SG) Lot No.: [id number] LRB: Left No. Used: 1 Action: Implanted   Implant Name: GLENOID MOD POST TM SZ2 - [id number] Type: Post Inv. Item: GLENOID MOD POST TM SZ2 Serial No.:  Manufacturer: [employer](ORTH,TRAU,BIO,SG) Lot  No.: [id number] LRB: Left No. Used: 1 Action: Implanted   Implant Name: HEAD HUMERAL COMP STD - [id number] Type: Orthopedic Implant Inv. Item: HEAD HUMERAL COMP STD Serial No.:  Manufacturer: [employer](ORTH,TRAU,BIO,SG) Lot No.: [id number] LRB: Left No. Used: 1 Action: Implanted   Implant Name: HEAD HUMERAL BIPOLAR [id number] - [id number] Type: Miscellaneous Inv. Item: HEAD HUMERAL BIPOLAR [id number] Serial No.:  Manufacturer: [employer](ORTH,TRAU,BIO,SG) Lot No.: [id number] LRB: Left No. Used: 1 Action: Implanted   Implant Name: STEM HUMERAL STRL [id number] - [id number] Type: Stem Inv. Item: STEM HUMERAL STRL [id number] Serial No.:  Manufacturer: [employer](ORTH,TRAU,BIO,SG) Lot No.: [id number] LRB: Left No. Used: 1 Action: Implanted   OPERATIVE FINDINGS: Advanced glenohumeral osteoarthritis involving the glenoid and the humeral head with substantial osteophyte formation inferiorly.   OPERATIVE PROCEDURE: The patient was brought to the operating room and placed in the supine position. General anesthesia was administered. IV antibiotics were given.  The upper extremity was prepped and draped in usual sterile fashion. The patient was in a beachchair position with all bony prominences padded.   Time out was performed and a deltopectoral approach was carried out. The biceps tendon was tenodesed to the pectoralis tendon.  I used a subscapularis peel technique with #2 max braid tag.  The inferior osteophyte was removed, and release of the capsule off of the humeral side was completed. The head was dislocated, and I reamed sequentially. I placed the humeral cutting guide at 30 of retroversion, and then pinned this into place, and made my humeral neck cut. This was at the appropriate level.   I then placed deep retractors and exposed the glenoid. I excised the labrum circumferentially, taking care to protect the axillary  nerve inferiorly.   I then placed a guidewire into the center  position, controlling appropriate version and inclination. I then reamed over the guidewire with the small reamer, and was satisfied with the preparation. I preserved the subchondral bone in order to maximize the strength and minimize the risk for subsequent subsidence.   I then drilled the central hole for the regenerex peg, and then placed the guide, and then drilled the 3 peripheral peg holes. I had excellent bony circumferential contact.  All 4 holes were contained, although the central hole was to the depth of the vault, and the anterior hole was just barely contained.  I then cleaned the glenoid, irrigated it copiously, and then dried it and cemented the prosthesis into place. Excellent seating was achieved. I had full exposure. The cement cured while I turned my attention to the humeral side.   I sequentially broached up to the selected size, with the broach set at 30 of retroversion. I placed 2 #5 and 1 #2 Maxbraid through the bone for subsequent repair.  I then placed the real stem. I trialed with multiple heads, and the above-named component was selected. Increased posterior coverage improved the coverage. The soft tissue tension was appropriate.   I then impacted the real humeral head into place, reduced the head, and irrigated copiously. Excellent stability and range of motion was achieved. I repaired the rotator interval and corner with #2 Maxbraid, and then repaired the subscapularis with the 2 #5 and 1 #2 Maxbraid previously placed through the bone, taking a second throw through the tendon to have a figure of 8 configuration through tendon and bone.  Excellent repair achieved and I irrigated copiously once more. The subcutaneous tissue was closed with Vicryl including the deltopectoral fascia.    The skin was closed with Steri-Strips and sterile gauze was applied. She had a preoperative nerve block. She tolerated the procedure well and there were no complications.

## 2024-08-05 NOTE — Transfer of Care (Signed)
 Immediate Anesthesia Transfer of Care Note  Patient: Amy Barr  Procedure(s) Performed: ARTHROPLASTY, SHOULDER, TOTAL (Left: Shoulder)  Patient Location: PACU  Anesthesia Type:General  Level of Consciousness: awake, alert , oriented, and patient cooperative  Airway & Oxygen Therapy: Patient Spontanous Breathing and Patient connected to face mask oxygen  Post-op Assessment: Report given to RN and Post -op Vital signs reviewed and stable  Post vital signs: Reviewed and stable  Last Vitals:  Vitals Value Taken Time  BP 123/88 08/05/24 09:52  Temp    Pulse 78 08/05/24 09:54  Resp 20 08/05/24 09:54  SpO2 100 % 08/05/24 09:54  Vitals shown include unfiled device data.  Last Pain:  Vitals:   08/05/24 0600  TempSrc:   PainSc: 0-No pain         Complications: No notable events documented.

## 2024-08-05 NOTE — Anesthesia Procedure Notes (Signed)
 Procedure Name: Intubation Date/Time: 08/05/2024 7:48 AM  Performed by: Kathern Rollene LABOR, CRNAPre-anesthesia Checklist: Patient identified, Emergency Drugs available, Suction available and Patient being monitored Patient Re-evaluated:Patient Re-evaluated prior to induction Oxygen Delivery Method: Circle system utilized Preoxygenation: Pre-oxygenation with 100% oxygen Induction Type: IV induction Ventilation: Mask ventilation without difficulty Laryngoscope Size: Mac and 3 Grade View: Grade I Tube type: Oral Tube size: 6.5 (6.5 ett per dr boone request) mm Number of attempts: 1 Airway Equipment and Method: Stylet Placement Confirmation: ETT inserted through vocal cords under direct vision, positive ETCO2 and breath sounds checked- equal and bilateral Secured at: 22 cm Tube secured with: Tape Dental Injury: Teeth and Oropharynx as per pre-operative assessment

## 2024-08-05 NOTE — Discharge Instructions (Signed)
 Shoulder Replacement Post-Operative Instructions  DIET:  Return to eating and drinking as you normally would. You will need to add some extra fluids (water) to prevent constipation.   WOUND CARE: The post-op dressing is not water proof, please cover your shoulder with Press n' Seal saran wrap or a plastic bag when showering If it fills with liquid or blood please call us  immediately to change it for you. There may be a small amount of fluid/bleeding leaking at the surgical site. This is normal after surgery.  Use your Ice machine or Ice as often as needed for pain relief. Always keep a towel, ACE wrap or other barrier between the cooling unit and your skin.  Do not soak the incision in water or submerge it.  Keep dry incisions as dry as possible.  REGIONAL ANESTHESIA (NERVE BLOCKS) The anesthesia team may have performed a nerve block for you if safe in the setting of your care.  This is a great tool used to minimize pain.  If you had a block, the short acting medicine will wear off between 8-24 hrs postop typically but the long acting usually will start working around 20-24 hrs postop.  There may be a period where you have more pain while waiting for the long-acting medicine to take effect.  Please use an extra dose of pain medication if needed at this point. We suggest you use the pain medication the first night prior to going to bed, in order to ease any pain when the anesthesia wears off. You should avoid taking pain medications on an empty stomach as it will make you nauseous.   MEDICATIONS:   Listed below are some of the medications that you will be given after surgery.  If you have questions about any of them please ask and we will be glad to go over them with you.  Prescriptions unless otherwise discussed are electronically sent to your pharmacy.   Oxycodone or Hydrocodone  - These are strong narcotics, to be used only on an "as needed" basis for pain.  If you think that you will need a  refill of this medicine, please plan ahead.  These will not be filled at night or over the weekend. Please note, we can only give 1 week at a time because of [place] law.  Stop this medicine as soon as you are able. Zofran  - take as needed for nausea or vomiting Acetaminophen (Tylenol ):  non-narcotic medicine for pain. Take to wean off the narcotics. You can pick this up over the counter at your pharmacy. If you were given hydrocodone  after surgery, hydrocodone  also contains Tylenol . You may take Tylenol  on top of your hydrocodone  or oxycodone but your total Tylenol  dose should not exceed 4000mg  in 24 hours.  Senokot-S - stool softener to help with constipation caused by narcotic pain medication  You should wean off your narcotic medicines as soon as you are able.  Most patients will be off or using minimal narcotics before their first postop appointment. Do not drink alcoholic beverages or take illicit drugs when taking pain medications. It is against the law to drive while taking narcotics.    Constipation:  Is common after surgery.  To prevent this, increase your fluid intake and dietary fiber. Reduce or stop using narcotic medicine.  You may also use prune juice, Colace, or Miralax 17 grams (one cap full) in 8 ounces of something to drink 1-3 times a day until bowel movements are regular.  You getting up and  moving will also help with constipation. Pain medication may make you constipated.  Below are a few solutions to try in this order: Decrease the amount of pain medication if you aren't having pain. Drink lots of decaffeinated fluids. Drink prune juice and/or each dried prunes  Precautions: If you experience any type of sudden onset of chest pain or sudden shortness of breath call 911!! Immediately.   For other issues - Fever over 101, increased swelling on surgical side, increased pain, wound drainage; please contact  the office [phone].   We would prefer to care for you in our office  rather than a trip to the ER.   Brace/Activity: A sling has been provided for you. You may remove the sling 3 times a day to move your elbow. Otherwise, you should be wearing your sling at all times.  You may remove the sling for showering, Keep your arm by your side during your shower. You may lift it minimally to wash under it and dry completely.  You may be more comfortable sleeping in a semi-seated position the first few nights following surgery.  Keep a pillow propped under the elbow and forearm for comfort.  If you have a recliner type of chair it might be beneficial.  If not, that is fine too, but it would be helpful to sleep propped up with pillows behind your operated shoulder as well under your elbow and forearm. You may return to work/school in the next couple of days when you feel up to it. Desk work and typing in the sling is fine. When dressing, put your operative arm in the sleeve first.  When getting undressed, take your operative arm out last.  Loose fitting, button-down shirts are recommended.  Often in the first days after surgery you may be more comfortable keeping your operative arm under your shirt and not through the sleeve. Do not lift anything heavier than 1 pound until we discuss it further in clinic.  Post op follow up:  Your first follow-up visit after surgery will be made prior to your surgery. This will be scheduled for approximately 2 weeks after surgery.  Your physical therapy schedule will be discussed with your surgeon.  Thank you for choosing our office to care for you!

## 2024-08-05 NOTE — Anesthesia Preprocedure Evaluation (Signed)
 Anesthesia Evaluation  Patient identified by MRN, date of birth, ID band Patient awake  General Assessment Comment:PONV only once before.  Reviewed: Allergy & Precautions, NPO status , Patient's Chart, lab work & pertinent test results  History of Anesthesia Complications (+) PONV and history of anesthetic complications  Airway Mallampati: II  TM Distance: >3 FB Neck ROM: Full    Dental no notable dental hx. (+) Teeth Intact   Pulmonary neg pulmonary ROS, neg sleep apnea, neg COPD, Patient abstained from smoking.Not current smoker, former smoker   Pulmonary exam normal breath sounds clear to auscultation       Cardiovascular Exercise Tolerance: Good METS(-) hypertension(-) CAD and (-) Past MI negative cardio ROS (-) dysrhythmias  Rhythm:Regular Rate:Normal - Systolic murmurs    Neuro/Psych  Headaches PSYCHIATRIC DISORDERS Anxiety        GI/Hepatic ,GERD  Controlled,,(+)     (-) substance abuse    Endo/Other  neg diabetes    Renal/GU negative Renal ROS     Musculoskeletal  (+) Arthritis ,    Abdominal   Peds  Hematology   Anesthesia Other Findings Past Medical History: No date: Anxiety No date: Arthritis No date: Complication of anesthesia No date: GERD (gastroesophageal reflux disease) No date: Heart murmur     Comment:  slight- not heard by PCP No date: History of adenomatous polyp of colon     Comment:  12-17-2015  TUBULAR ADENOMA AND HYPERPLASTIC No date: History of cardiac murmur     Comment:  per pt told was mild at 1989 surgery No date: History of multiple concussions     Comment:  per pt last one 1992 , approx.,  residual migraines               which is along with hereditary migraines No date: Migraine No date: PMB (postmenopausal bleeding) No date: PONV (postoperative nausea and vomiting) No date: Wears glasses  Reproductive/Obstetrics                               Anesthesia Physical Anesthesia Plan  ASA: 2  Anesthesia Plan: General   Post-op Pain Management: Regional block* and Tylenol PO (pre-op)*   Induction: Intravenous  PONV Risk Score and Plan: 4 or greater and Ondansetron , Dexamethasone  and Midazolam   Airway Management Planned: Oral ETT  Additional Equipment: None  Intra-op Plan:   Post-operative Plan: Extubation in OR  Informed Consent: I have reviewed the patients History and Physical, chart, labs and discussed the procedure including the risks, benefits and alternatives for the proposed anesthesia with the patient or authorized representative who has indicated his/her understanding and acceptance.     Dental advisory given  Plan Discussed with: CRNA and Surgeon  Anesthesia Plan Comments: (Discussed risks of anesthesia with patient, including PONV, sore throat, lip/dental/eye damage. Rare risks discussed as well, such as cardiorespiratory and neurological sequelae, and allergic reactions. Discussed the role of CRNA in patient's perioperative care. Patient understands. Discussed r/b/a of interscalene block, including elective nature. Risks discussed: - Rare: bleeding, infection, nerve damage - shortness of breath from hemidiaphragmatic paralysis - unilateral horner's syndrome - poor/non-working blocks - reactions and toxicity to local anesthetic Patient understands and agrees. )        Anesthesia Quick Evaluation

## 2024-08-05 NOTE — Interval H&P Note (Signed)
 History and Physical Interval Note:  08/05/2024 7:26 AM  Amy Barr  has presented today for surgery, with the diagnosis of OA LEFT SHOULDER.  The various methods of treatment have been discussed with the patient and family. After consideration of risks, benefits and other options for treatment, the patient has consented to  Procedure(s): ARTHROPLASTY, SHOULDER, TOTAL (Left) as a surgical intervention.  The patient's history has been reviewed, patient examined, no change in status, stable for surgery.  I have reviewed the patient's chart and labs.  Questions were answered to the patient's satisfaction.     [name]

## 2024-08-05 NOTE — Anesthesia Postprocedure Evaluation (Signed)
 Anesthesia Post Note  Patient: Amy Barr  Procedure(s) Performed: ARTHROPLASTY, SHOULDER, TOTAL (Left: Shoulder)     Patient location during evaluation: PACU Anesthesia Type: General Level of consciousness: awake and alert Pain management: pain level controlled Vital Signs Assessment: post-procedure vital signs reviewed and stable Respiratory status: spontaneous breathing, nonlabored ventilation, respiratory function stable and patient connected to nasal cannula oxygen Cardiovascular status: blood pressure returned to baseline and stable Postop Assessment: no apparent nausea or vomiting Anesthetic complications: no   No notable events documented.  Last Vitals:  Vitals:   08/05/24 1030 08/05/24 1031  BP: 123/84   Pulse: 66 64  Resp: 18 18  Temp:  (!) 36.1 C  SpO2: 97% 94%    Last Pain:  Vitals:   08/05/24 1031  TempSrc: Temporal  PainSc: 0-No pain                 Rome Ade

## 2024-08-05 NOTE — Evaluation (Signed)
 Occupational Therapy Evaluation Patient Details Name: Amy Barr MRN: [id number] DOB: 01-26-1966 Today's Date: 08/05/2024   History of Present Illness   Aolanis Crispen is a 58 y.o. 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 The patient has a history of pain and functional disability in the left shoulder due to arthritis and has failed non-surgical conservative treatments for greater than 12 weeks to include NSAID's and/or analgesics, corticosteriod injections, and activity modification.  Onset of symptoms was gradual, starting 2 years ago with gradually worsening course since that time. Pt now s/p L shoulder TSA     Clinical Impressions Pt is at min A level with ADLs/selfcare, Ind with mobility, no LOB, dizziness or SOB. Pt and spouse educated on shoulder protocol, compensatory bathing/dressing techniques, ice machine, L hand/wrist/elbow ROM and able to return demo; handouts provided. Pt's spouse will be able to assist prn at home. All education completed and no further acute OT services are indicated at this time.     If plan is discharge home, recommend the following:   A little help with bathing/dressing/bathroom;Assistance with cooking/housework;Assist for transportation     Functional Status Assessment   Patient has had a recent decline in their functional status and demonstrates the ability to make significant improvements in function in a reasonable and predictable amount of time.     Equipment Recommendations   None recommended by OT     Recommendations for Other Services         Precautions/Restrictions   Precautions Precautions: Shoulder Type of Shoulder Precautions: L UE NWB, sling, NO A/PROM L shoulder Shoulder Interventions: Shoulder sling/immobilizer;At all times;Off for dressing/bathing/exercises Precaution Booklet Issued: Yes (comment) Precaution/Restrictions Comments: shoulder protocol, sling wear, ice machine, ROM exercises L  hand/wrist/elbow Required Braces or Orthoses: Sling Restrictions Weight Bearing Restrictions Per Provider Order: Yes LUE Weight Bearing Per Provider Order: Non weight bearing     Mobility Bed Mobility Overal bed mobility: Independent                  Transfers Overall transfer level: Independent                        Balance Overall balance assessment: No apparent balance deficits (not formally assessed), Independent                                         ADL either performed or assessed with clinical judgement   ADL Overall ADL's : Needs assistance/impaired Eating/Feeding: Set up;Independent;Sitting   Grooming: Wash/dry hands;Wash/dry face;Brushing hair;Oral care;Set up       Lower Body Bathing: Minimal assistance   Upper Body Dressing : Minimal assistance   Lower Body Dressing: Minimal assistance   Toilet Transfer: Modified Independent   Toileting- Clothing Manipulation and Hygiene: Minimal assistance   Tub/ Shower Transfer: Supervision/safety   Functional mobility during ADLs: Supervision/safety;Modified independent General ADL Comments: no LOB, dizziness or SOB. Pt and spouse educated on compensatory bathing/dressing techniques and able to return demo, handouts provided     Vision Baseline Vision/History: 1 Wears glasses Ability to See in Adequate Light: 0 Adequate Patient Visual Report: No change from baseline       Perception         Praxis         Pertinent Vitals/Pain Pain Assessment Pain Assessment: No/denies pain     Extremity/Trunk Assessment Upper Extremity  Assessment Upper Extremity Assessment: Right hand dominant;LUE deficits/detail RUE: Unable to fully assess due to immobilization LUE Deficits / Details: NWB, sling   Lower Extremity Assessment Lower Extremity Assessment: Defer to PT evaluation   Cervical / Trunk Assessment Cervical / Trunk Assessment: Normal   Communication  Communication Communication: No apparent difficulties   Cognition Arousal: Alert Behavior During Therapy: WFL for tasks assessed/performed Cognition: No apparent impairments                               Following commands: Intact       Cueing  General Comments          Exercises Other Exercises Other Exercises: ROM exercies of L hand, wrist, elbow as allowed per MD; handouts provided   Shoulder Instructions Shoulder Instructions Donning/doffing shirt without moving shoulder: Min-guard Method for sponge bathing under operated UE: Supervision/safety Correct positioning of sling/immobilizer: Minimal assistance ROM for elbow, wrist and digits of operated UE: Supervision/safety Sling wearing schedule (on at all times/off for ADL's): Independent Proper positioning of operated UE when showering: Independent Positioning of UE while sleeping: Independent    Home Living Family/patient expects to be discharged to:: Private residence Living Arrangements: Spouse/significant other Available Help at Discharge: Family Type of Home: House Home Access: Stairs to enter Secretary/administrator of Steps: 4 Entrance Stairs-Rails: Right Home Layout: One level     Bathroom Shower/Tub: Chief Strategy Officer: Handicapped height     Home Equipment: Toilet riser          Prior Functioning/Environment Prior Level of Function : Independent/Modified Independent;Driving             Mobility Comments: no AD ADLs Comments: Ind with ADLs, IADLs, home mgt    OT Problem List: Impaired UE functional use;Decreased range of motion;Decreased coordination   OT Treatment/Interventions:        OT Goals(Current goals can be found in the care plan section)   Acute Rehab OT Goals Patient Stated Goal: go home   OT Frequency:       Co-evaluation              AM-PAC OT 6 Clicks Daily Activity     Outcome Measure Help from another person eating meals?:  None Help from another person taking care of personal grooming?: A Little Help from another person toileting, which includes using toliet, bedpan, or urinal?: A Little Help from another person bathing (including washing, rinsing, drying)?: A Little Help from another person to put on and taking off regular upper body clothing?: A Little Help from another person to put on and taking off regular lower body clothing?: A Little 6 Click Score: 19   End of Session Equipment Utilized During Treatment: Gait belt Nurse Communication: Mobility status  Activity Tolerance: Patient tolerated treatment well Patient left: in chair;with nursing/sitter in room;with family/visitor present  OT Visit Diagnosis: Muscle weakness (generalized) (M62.81)                Time: 8947-8869 OT Time Calculation (min): 38 min Charges:  OT General Charges $OT Visit: 1 Visit OT Evaluation $OT Eval Low Complexity: 1 Low OT Treatments $Self Care/Home Management : 8-22 mins $Therapeutic Activity: 8-22 mins   Jacques Aquas [facility] 08/05/2024, 11:42 AM

## 2024-08-06 ENCOUNTER — Encounter (HOSPITAL_COMMUNITY): Payer: Self-pay | Admitting: Orthopedic Surgery
